# Patient Record
Sex: Female | Born: 1995
Health system: Southern US, Community
[De-identification: ages and names within clinical notes are randomized; demographics above are authoritative.]

## PROBLEM LIST (undated history)

## (undated) DIAGNOSIS — K589 Irritable bowel syndrome without diarrhea: Secondary | ICD-10-CM

## (undated) HISTORY — DX: Irritable bowel syndrome, unspecified: K58.9

---

## 2013-11-05 ENCOUNTER — Encounter: Payer: Self-pay | Admitting: Family Medicine

## 2013-11-05 ENCOUNTER — Ambulatory Visit (INDEPENDENT_AMBULATORY_CARE_PROVIDER_SITE_OTHER): Payer: Managed Care, Other (non HMO) | Admitting: Family Medicine

## 2013-11-05 VITALS — BP 117/72 | HR 93 | Temp 99.1°F | Resp 16 | Ht 63.5 in | Wt 120.2 lb

## 2013-11-05 DIAGNOSIS — Z7689 Persons encountering health services in other specified circumstances: Secondary | ICD-10-CM

## 2013-11-05 DIAGNOSIS — Z0000 Encounter for general adult medical examination without abnormal findings: Secondary | ICD-10-CM

## 2013-11-05 DIAGNOSIS — Z113 Encounter for screening for infections with a predominantly sexual mode of transmission: Secondary | ICD-10-CM

## 2013-11-05 DIAGNOSIS — K589 Irritable bowel syndrome without diarrhea: Secondary | ICD-10-CM

## 2013-11-05 DIAGNOSIS — Z3009 Encounter for other general counseling and advice on contraception: Secondary | ICD-10-CM

## 2013-11-05 DIAGNOSIS — Z23 Encounter for immunization: Secondary | ICD-10-CM

## 2013-11-05 MED ORDER — NORETHIN ACE-ETH ESTRAD-FE 1-20 MG-MCG(24) PO CHEW: CHEWABLE_TABLET | ORAL | Status: DC

## 2013-11-05 MED ORDER — OMEPRAZOLE 40 MG PO CPDR
40.0000 mg | DELAYED_RELEASE_CAPSULE | Freq: Every day | ORAL | Status: DC
Start: 2013-11-05 — End: 2014-12-31

## 2013-11-05 NOTE — Patient Instructions (Signed)
Great to see you today, I will be in touch with your labs.  Take care!

## 2013-11-05 NOTE — Progress Notes (Addendum)
[facility] [address] [place] [address] [phone]  Date:  11/05/2013   Name:  Misty Williams   DOB:  11-02-1995   MRN:  [id number]  PCP:  No primary provider on file.    Chief Complaint: Establish Care and Irritable Bowel Syndrome   History of Present Illness:  Misty Williams is a 18 y.o. very pleasant female patient who presents with the following:  Here today to establish care.  She is generally healthy except for IBS.  She works full- time doing hair.   She is on OCP and prilosec.  She needs her prilosec refilled and is running low on her OCP.   She does not yet need a pap (she has actually had a couple of paps in the past but never an abnormal)   She has had sx of IBS for a couple of years, formally dx in march of this year.  She did see GI,but has not yet had a colonoscopy.   She would like gonorrhea/ chlamydia screening today but declines BW  There are no active problems to display for this patient.   Past Medical History  Diagnosis Date  . IBS (irritable bowel syndrome)     No past surgical history on file.  History  Substance Use Topics  . Smoking status: Never Smoker   . Smokeless tobacco: Not on file  . Alcohol Use: Not on file    Family History  Problem Relation Age of Onset  . Hyperlipidemia Mother   . Hypertension Mother   . Heart disease Maternal Grandfather     No Known Allergies  Medication list has been reviewed and updated.  No current outpatient prescriptions on file prior to visit.   No current facility-administered medications on file prior to visit.    Review of Systems:  As per HPI- otherwise negative.   Physical Examination: Filed Vitals:   11/05/13 1044  BP: 117/72  Pulse: 93  Temp: 99.1 F (37.3 C)  Resp: 16   Filed Vitals:   11/05/13 1044  Height: 5' 3.5" (1.613 m)  Weight: 120 lb 3.2 oz (54.522 kg)   Body mass index is 20.96 kg/(m^2). Ideal Body Weight: Weight in (lb) to have BMI = 25:  143.1  GEN: WDWN, NAD, Non-toxic, A & O x 3, looks well HEENT: Atraumatic, Normocephalic. Neck supple. No masses, No LAD. Ears and Nose: No external deformity. CV: RRR, No M/G/R. No JVD. No thrill. No extra heart sounds. PULM: CTA B, no wheezes, crackles, rhonchi. No retractions. No resp. distress. No accessory muscle use. ABD: S, NT, ND EXTR: No c/c/e NEURO Normal gait.  PSYCH: Normally interactive. Conversant. Not depressed or anxious appearing.  Calm demeanor.    Assessment and Plan: IBS (irritable bowel syndrome) - Plan: omeprazole (PRILOSEC) 40 MG capsule  Encounter for other general counseling or advice on contraception - Plan: Norethin Ace-Eth Estrad-FE (MINASTRIN 24 FE) 1-20 MG-MCG(24) CHEW  Screening for STD (sexually transmitted disease) - Plan: GC/Chlamydia Probe Amp  Immunizations reviewed and up to date - Plan: Flu vaccine greater than or equal to 3yo preservative free IM  Healthy young lady here to establish care.  Flu shot, refilled her medications and did genprobe  Signed Abbe Amsterdam, MD  9/29: received genprobe, negative.  Called and LMOM that her labs are negative.

## 2013-11-06 LAB — GC/CHLAMYDIA PROBE AMP
CT Probe RNA: NEGATIVE
GC Probe RNA: NEGATIVE

## 2014-03-15 ENCOUNTER — Ambulatory Visit (INDEPENDENT_AMBULATORY_CARE_PROVIDER_SITE_OTHER): Payer: BLUE CROSS/BLUE SHIELD | Admitting: Family Medicine

## 2014-03-15 VITALS — BP 100/68 | HR 79 | Temp 98.7°F | Resp 18 | Ht 64.0 in | Wt 125.0 lb

## 2014-03-15 DIAGNOSIS — J029 Acute pharyngitis, unspecified: Secondary | ICD-10-CM

## 2014-03-15 DIAGNOSIS — Z3009 Encounter for other general counseling and advice on contraception: Secondary | ICD-10-CM

## 2014-03-15 MED ORDER — AMOXICILLIN 875 MG PO TABS: 875.0000 mg | ORAL_TABLET | Freq: Two times a day (BID) | ORAL | Status: DC

## 2014-03-15 MED ORDER — NORETHIN ACE-ETH ESTRAD-FE 1-20 MG-MCG(24) PO CHEW: CHEWABLE_TABLET | ORAL | Status: DC

## 2014-03-15 NOTE — Progress Notes (Signed)
° °  Subjective:    Patient ID: Misty Williams, female    DOB: [birth year], 19 y.o.   MRN: [id number] This chart was scribed for Elvina SidleKurt Lauenstein, MD by Littie Deedsichard Sun, Medical Scribe. This patient was seen in Room 10 and the patient's care was started at 2:17 PM.   HPI HPI Comments: Misty Williams is a 19 y.o. female who [facility] complaining of gradual onset URI symptoms that started earlier today. Patient reports having sore throat, nasal congestion, ear pain, ears feeling stopped, and sinus pressure. Patient just came back from [place] recently and states that she had contact with someone with strep throat. NKDA.  Patient is a Production designer, theatre/television/filmmanager at [employer].  Review of Systems  HENT: Positive for congestion, ear pain, sinus pressure and sore throat.        Objective:   Physical Exam CONSTITUTIONAL: Well developed/well nourished HEAD: Normocephalic/atraumatic EYES: EOM/PERRL ENMT: Mucous membranes moist, reddened right posterior pharynx without exudeate NECK: supple no meningeal signs SPINE: entire spine nontender CV: S1/S2 noted, no murmurs/rubs/gallops noted LUNGS: Lungs are clear to auscultation bilaterally, no apparent distress ABDOMEN: soft, nontender, no rebound or guarding GU: no cva tenderness NEURO: Pt is awake/alert, moves all extremitiesx4 EXTREMITIES: pulses normal, full ROM SKIN: warm, color normal PSYCH: no abnormalities of mood noted        Assessment & Plan:   This chart was scribed in my presence and reviewed by me personally.    ICD-9-CM ICD-10-CM   1. Acute pharyngitis, unspecified pharyngitis type 462 J02.9 amoxicillin (AMOXIL) 875 MG tablet  2. Encounter for other general counseling or advice on contraception V25.09 Z30.09 Norethin Ace-Eth Estrad-FE (MINASTRIN 24 FE) 1-20 MG-MCG(24) CHEW     Signed, Elvina SidleKurt Lauenstein, MD

## 2014-04-03 ENCOUNTER — Ambulatory Visit (INDEPENDENT_AMBULATORY_CARE_PROVIDER_SITE_OTHER): Payer: BLUE CROSS/BLUE SHIELD | Admitting: Family Medicine

## 2014-04-03 VITALS — BP 118/78 | HR 84 | Temp 98.5°F | Resp 16 | Ht 64.5 in | Wt 125.2 lb

## 2014-04-03 DIAGNOSIS — J329 Chronic sinusitis, unspecified: Secondary | ICD-10-CM

## 2014-04-03 DIAGNOSIS — L309 Dermatitis, unspecified: Secondary | ICD-10-CM

## 2014-04-03 MED ORDER — AZITHROMYCIN 250 MG PO TABS: ORAL_TABLET | ORAL | Status: DC

## 2014-04-03 MED ORDER — FLUTICASONE PROPIONATE 50 MCG/ACT NA SUSP: NASAL | Status: DC

## 2014-04-03 NOTE — Patient Instructions (Addendum)
Consider yourself possibly allergic to amoxicillin. If it is an amoxicillin allergy, I would consider it as something mild. Amoxicillin can cause a nonallergic rash. However I would avoid all penicillins, but not necessarily all cephalosporins(Keflex, cephalexin, Omnicef, cefdinir, ceftriaxone, Rocephin, etc.)  Take the a Zithromax (azithromycin) 2 pills initially, then 1 daily for 4 days for antibiotic  Use the fluticasone nose spray 2 sprays each nostril twice daily for 3 days, then once daily  If you're rash develops more itching take Zyrtec (cetirizine) one pill daily, over-the-counter  Return if worse  Rash also was possibly pityriasis rosea, though probably amoxicillin induced.  Pityriasis Rosea Pityriasis rosea is a rash which is probably caused by a virus. It generally starts as a scaly, red patch on the trunk (the area of the body that a t-shirt would cover) but does not appear on sun exposed areas. The rash is usually preceded by an initial larger spot called the "herald patch" a week or more before the rest of the rash appears. Generally within one to two days the rash appears rapidly on the trunk, upper arms, and sometimes the upper legs. The rash usually appears as flat, oval patches of scaly pink color. The rash can also be raised and one is able to feel it with a finger. The rash can also be finely crinkled and may slough off leaving a ring of scale around the spot. Sometimes a mild sore throat is present with the rash. It usually affects children and young adults in the spring and autumn. Women are more frequently affected than men. TREATMENT  Pityriasis rosea is a self-limited condition. This means it goes away within 4 to 8 weeks without treatment. The spots may persist for several months, especially in darker-colored skin after the rash has resolved and healed. Benadryl and steroid creams may be used if itching is a problem. SEEK MEDICAL CARE IF:   Your rash does not go away or  persists longer than three months.  You develop fever and joint pain.  You develop severe headache and confusion.  You develop breathing difficulty, vomiting and/or extreme weakness. Document Released: 03/03/2001 Document Revised: 04/19/2011 Document Reviewed: 03/22/2008 [facility] Patient Information 2015 [place]. This information is not intended to replace advice given to you by your health care provider. Make sure you discuss any questions you have with your health care provider.

## 2014-04-03 NOTE — Progress Notes (Signed)
Subjective: Patient took only part of her amoxicillin prescription last time. She developed a trocar rash. That is continued to persist some. It did not itch a lot. She had a more prominent spot in her left upper arm axillary region about 1.5 cm in diameter. She still has her sinus symptoms. It improved but is not gone away. She is blowing some purulent and some clear mucus and continues to feel run down from that. She has little cough from that.  Objective: Sinuses nontender. Throat clear. Neck supple without nodes. Chest clear to all station. She has a friend splotchy rash on her trunk that has the appearance of a pityriasis rosea in resolution.  Assessment: Amoxicillin allergy versus pityriasis rosea rash Sinusitis  Plan: Treat her with a course of azithromycin to try and see if we can clear the sinuses up the rest of the way, though I told her this could still be just a prolonged viral issue. Explained to her that she has to be considered allergic to amoxicillin, though if it was a penicillin reaction it was a mild one. I don't think she needs to avoid cephalosporins though she needs to be cautious with him. There is no anaphylaxis.  See instructions

## 2014-04-15 ENCOUNTER — Ambulatory Visit (INDEPENDENT_AMBULATORY_CARE_PROVIDER_SITE_OTHER): Payer: BLUE CROSS/BLUE SHIELD | Admitting: Physician Assistant

## 2014-04-15 VITALS — BP 118/70 | HR 73 | Temp 98.2°F | Resp 16 | Ht 63.5 in | Wt 123.4 lb

## 2014-04-15 DIAGNOSIS — H9191 Unspecified hearing loss, right ear: Secondary | ICD-10-CM | POA: Diagnosis not present

## 2014-04-15 DIAGNOSIS — R0981 Nasal congestion: Secondary | ICD-10-CM | POA: Diagnosis not present

## 2014-04-15 MED ORDER — GUAIFENESIN ER 1200 MG PO TB12: 1.0000 | ORAL_TABLET | Freq: Two times a day (BID) | ORAL | Status: AC | PRN

## 2014-04-15 MED ORDER — IPRATROPIUM BROMIDE 0.03 % NA SOLN: 2.0000 | Freq: Two times a day (BID) | NASAL | Status: DC

## 2014-04-15 MED ORDER — PSEUDOEPHEDRINE HCL 60 MG PO TABS: 60.0000 mg | ORAL_TABLET | ORAL | Status: AC | PRN

## 2014-04-15 NOTE — Progress Notes (Signed)
[facility] [address], [place] [id number] [phone]  Date:  04/15/2014   Name:  Misty DublinCarlyn Tyminski   DOB:  [birth year]   MRN:  [id number]  PCP:  No primary care provider on file.    Chief Complaint: Ear Fullness   History of Present Illness:  Misty Williams is a 19 y.o. very pleasant female patient who presents with the following:  Patient was seen here 2 weeks ago for a sinus infection, and 1 month ago for pharyngitis.  These had resolved.  But 2 days ago, patient reports that she has been felt as if her right ear is clogged.  She has no fever, drainage, or dizziness.  She had some nasal congestion, but this has since resolved.  She has no facial weakness, or rash.     There are no active problems to display for this patient.   Past Medical History  Diagnosis Date  . IBS (irritable bowel syndrome)     History reviewed. No pertinent past surgical history.  History  Substance Use Topics  . Smoking status: Never Smoker   . Smokeless tobacco: Not on file  . Alcohol Use: Not on file    Family History  Problem Relation Age of Onset  . Hyperlipidemia Mother   . Hypertension Mother   . Heart disease Maternal Grandfather     Allergies  Allergen Reactions  . Penicillins Rash    Medication list has been reviewed and updated.  Current Outpatient Prescriptions on File Prior to Visit  Medication Sig Dispense Refill  . Norethin Ace-Eth Estrad-FE (MINASTRIN 24 FE) 1-20 MG-MCG(24) CHEW Take 1 daily 84 tablet 3  . omeprazole (PRILOSEC) 40 MG capsule Take 1 capsule (40 mg total) by mouth daily. 90 capsule 3  . fluticasone (FLONASE) 50 MCG/ACT nasal spray Use 2 sprays each nostril twice daily for 3 days, then once daily (Patient not taking: Reported on 04/15/2014) 16 g 1   No current facility-administered medications on file prior to visit.    Review of Systems: ROS otherwise unremarkable unless listed above.    Physical Examination: Filed Vitals:   04/15/14 1124  BP: 118/70  Pulse: 73  Temp: 98.2 F (36.8 C)  Resp: 16   Filed Vitals:   04/15/14 1124  Height: 5' 3.5" (1.613 m)  Weight: 123 lb 6.4 oz (55.974 kg)   Body mass index is 21.51 kg/(m^2). Ideal Body Weight: Weight in (lb) to have BMI = 25: 143.1  Physical Exam  Constitutional: She appears well-developed and well-nourished.  HENT:  Right Ear: Tympanic membrane, external ear and ear canal normal.  Left Ear: Tympanic membrane, external ear and ear canal normal.  Nose: Rhinorrhea present. No mucosal edema.  Mouth/Throat: No oropharyngeal exudate, posterior oropharyngeal edema or posterior oropharyngeal erythema.  Cardiovascular: Normal rate and regular rhythm.  Exam reveals no distant heart sounds and no friction rub.   No murmur heard. Pulmonary/Chest: No respiratory distress. She has no decreased breath sounds. She has no wheezes.    Hearing Screening   125Hz  250Hz  500Hz  1000Hz  2000Hz  4000Hz  8000Hz   Right ear:   30 10 15 20  00  Left ear:   00 00 05 05 00     Assessment and Plan: 19 year old female is here for chief complaint of hearing loss.  I am most suspicious of eustachian tube dysfunction which may take days to weeks to return to normal.  VS nl.  Audiometry showed mild hearing loss of right ear.  Passed whispered test from 5 ft.  I will treat supportively to decrease mucus, and rtc if sxs do not resolve in 2 weeks, where ent referral would be appreciated.   Hearing loss, right - Plan: PR TYMPANOMETRY, ipratropium (ATROVENT) 0.03 % nasal spray  Nasal congestion - Plan: pseudoephedrine (SUDAFED) 60 MG tablet, Guaifenesin (MUCINEX MAXIMUM STRENGTH) 1200 MG TB12  Trena Platt, PA-C [facility] 3/9/201610:14 AM

## 2014-04-15 NOTE — Patient Instructions (Signed)
Stay hydrated.  Take the medication as prescribed.

## 2014-12-31 ENCOUNTER — Ambulatory Visit (INDEPENDENT_AMBULATORY_CARE_PROVIDER_SITE_OTHER): Payer: BLUE CROSS/BLUE SHIELD | Admitting: Physician Assistant

## 2014-12-31 ENCOUNTER — Encounter: Payer: Self-pay | Admitting: Physician Assistant

## 2014-12-31 VITALS — BP 103/68 | HR 80 | Temp 98.5°F | Resp 16 | Ht 64.0 in | Wt 118.0 lb

## 2014-12-31 DIAGNOSIS — Z3009 Encounter for other general counseling and advice on contraception: Secondary | ICD-10-CM | POA: Diagnosis not present

## 2014-12-31 DIAGNOSIS — J069 Acute upper respiratory infection, unspecified: Secondary | ICD-10-CM

## 2014-12-31 MED ORDER — NORETHIN ACE-ETH ESTRAD-FE 1-20 MG-MCG(24) PO CHEW: CHEWABLE_TABLET | ORAL | Status: DC

## 2014-12-31 NOTE — Progress Notes (Signed)
[facility] [address] [place] [address] [phone]  Date:  12/31/2014   Name:  Misty Williams   DOB:  10-03-1995   MRN:  [id number]  PCP:  No primary care provider on file.    Chief Complaint: Sore Throat; Cough; Sinus Pressure; and Medication Refill   History of Present Illness:  This is a 19 y.o. female with PMH IBS who is presenting with uri symptoms for the past 10 days. Started with runny nose and fatigue. Intermittent cough throughout. Started with sore throat 4 days ago. Started with hoarseness 2 days ago. Overall cough and nasal congestion getting better. Sore throat about the same. She is having some bilateral ear soreness. Denies sob/wheezing, fever, chills. Her sister was sick with similar illness before her.  Aggravating/alleviating factors: Mucinex D, sudafed and nyquil - nothing helping much.  History of asthma: no  History of env allergies: spring time allergies Tobacco use: no  Pt needs refill of OCP. She has been on OCP for several years and no problems. She was started on OCP d/t painful menses. She has not had any new sexual partners and does not want STD testing today. She is due for pap - has upcoming CPE with Dr. Patsy Lager 03/2015.   Review of Systems:  Review of Systems See HPI  There are no active problems to display for this patient.   Prior to Admission medications   Medication Sig Start Date End Date Taking? Authorizing Provider  fluticasone (FLONASE) 50 MCG/ACT nasal spray Use 2 sprays each nostril twice daily for 3 days, then once daily 04/03/14  Yes Peyton Najjar, MD  Norethin Ace-Eth Estrad-FE (MINASTRIN 24 FE) 1-20 MG-MCG(24) CHEW Take 1 daily 03/15/14  Yes Elvina Sidle, MD           Allergies  Allergen Reactions  . Penicillins Rash    History reviewed. No pertinent past surgical history.  Social History  Substance Use Topics  . Smoking status: Never Smoker   . Smokeless tobacco: None  . Alcohol Use: None     Family History  Problem Relation Age of Onset  . Hyperlipidemia Mother   . Hypertension Mother   . Heart disease Maternal Grandfather     Medication list has been reviewed and updated.  Physical Examination:  Physical Exam  Constitutional: She is oriented to person, place, and time. She appears well-developed and well-nourished. No distress.  HENT:  Head: Normocephalic and atraumatic.  Right Ear: Hearing, tympanic membrane, external ear and ear canal normal.  Left Ear: Hearing, tympanic membrane, external ear and ear canal normal.  Nose: Nose normal. Right sinus exhibits no maxillary sinus tenderness and no frontal sinus tenderness. Left sinus exhibits no maxillary sinus tenderness and no frontal sinus tenderness.  Mouth/Throat: Uvula is midline and mucous membranes are normal. Posterior oropharyngeal erythema present. No oropharyngeal exudate or posterior oropharyngeal edema.  Eyes: Conjunctivae and lids are normal. Right eye exhibits no discharge. Left eye exhibits no discharge. No scleral icterus.  Cardiovascular: Normal rate, regular rhythm, normal heart sounds and normal pulses.   No murmur heard. Pulmonary/Chest: Effort normal and breath sounds normal. No respiratory distress. She has no wheezes. She has no rhonchi. She has no rales.  Musculoskeletal: Normal range of motion.  Lymphadenopathy:       Head (right side): No submental, no submandibular and no tonsillar adenopathy present.       Head (left side): No submental, no submandibular and no tonsillar adenopathy present.  She has no cervical adenopathy.  Neurological: She is alert and oriented to person, place, and time.  Skin: Skin is warm, dry and intact. No lesion and no rash noted.  Psychiatric: She has a normal mood and affect. Her speech is normal and behavior is normal. Thought content normal.    BP 103/68 mmHg  Pulse 80  Temp(Src) 98.5 F (36.9 C) (Oral)  Resp 16  Ht 5\' 4"  (1.626 m)  Wt 118 lb (53.524  kg)  BMI 20.24 kg/m2  SpO2 99%  LMP 12/19/2014  Assessment and Plan:  1. Encounter for other general counseling or advice on contraception Doing well on current OCP. Refilled. F/u with Dr. Patsy [place]ager[place] 03/2015. - Norethin Ace-Eth Estrad-FE (MINASTRIN 24 FE) 1-20 MG-MCG(24) CHEW; Take 1 daily  Dispense: 84 tablet; Refill: 3  2. Viral URI Symptoms likely d/t viral uri. Symptoms overall improving. Exam benign and vitals wnl. She has atrovent at home that she will use BID. Ibuprofen for sore throat. She will let me know if symptoms not improved in 1 week and will consider prescribing abx.   Roswell MinersNicole V. Dyke BrackettBush, PA-C, MHS [facility]  12/31/2014

## 2014-12-31 NOTE — Patient Instructions (Signed)
Use atrovent 2 sprays twice a day. Stop mucinex and sudafed. Drink plenty of water, at least 64 oz water a day. Ibuprofen as needed for sore throat. Rest your voice. Let me know if your symptoms have not improved in 1 week.

## 2015-03-12 ENCOUNTER — Encounter: Payer: BLUE CROSS/BLUE SHIELD | Admitting: Family Medicine

## 2015-10-08 ENCOUNTER — Ambulatory Visit (INDEPENDENT_AMBULATORY_CARE_PROVIDER_SITE_OTHER): Payer: No Typology Code available for payment source | Admitting: Physician Assistant

## 2015-10-08 VITALS — BP 100/70 | HR 82 | Temp 99.0°F | Resp 16 | Ht 63.0 in | Wt 124.4 lb

## 2015-10-08 DIAGNOSIS — Z309 Encounter for contraceptive management, unspecified: Secondary | ICD-10-CM | POA: Diagnosis not present

## 2015-10-08 MED ORDER — NORETHINDRONE ACET-ETHINYL EST 1-20 MG-MCG PO TABS: 1.0000 | ORAL_TABLET | Freq: Every day | ORAL | 11 refills | Status: DC

## 2015-10-08 NOTE — Patient Instructions (Addendum)
Ethinyl Estradiol; Norethindrone Acetate; Ferrous fumarate tablets (contraception) What is this medicine? ETHINYL ESTRADIOL; NORETHINDRONE ACETATE; FERROUS FUMARATE (ETH in il es tra DYE ole; nor eth IN drone AS e tate; FER us FUE ma rate) is an oral contraceptive. The products combine two types of female hormones, an estrogen and a progestin. They are used to prevent ovulation and pregnancy. Some products are also used to treat acne in females. This medicine may be used for other purposes; ask your health care provider or pharmacist if you have questions. What should I tell my health care provider before I take this medicine? They need to know if you have any of these conditions: -abnormal vaginal bleeding -blood vessel disease -breast, cervical, endometrial, ovarian, liver, or uterine cancer -diabetes -gallbladder disease -heart disease or recent heart attack -high blood pressure -high cholesterol -history of blood clots -kidney disease -liver disease -migraine headaches -smoke tobacco -stroke -systemic lupus erythematosus (SLE) -an unusual or allergic reaction to estrogens, progestins, other medicines, foods, dyes, or preservatives -pregnant or trying to get pregnant -breast-feeding How should I use this medicine? Take this medicine by mouth. To reduce nausea, this medicine may be taken with food. Follow the directions on the prescription label. Take this medicine at the same time each day and in the order directed on the package. Do not take your medicine more often than directed. A patient package insert for the product will be given with each prescription and refill. Read this sheet carefully each time. The sheet may change frequently. Contact your pediatrician regarding the use of this medicine in children. Special care may be needed. This medicine has been used in female children who have started having menstrual periods. Overdosage: If you think you have taken too much of this  medicine contact a poison control center or emergency room at once. NOTE: This medicine is only for you. Do not share this medicine with others. What if I miss a dose? If you miss a dose, refer to the patient information sheet you received with your medicine for direction. If you miss more than one pill, this medicine may not be as effective and you may need to use another form of birth control. What may interact with this medicine? -acetaminophen -antibiotics or medicines for infections, especially rifampin, rifabutin, rifapentine, and griseofulvin, and possibly penicillins or tetracyclines -aprepitant -ascorbic acid (vitamin C) -atorvastatin -barbiturate medicines, such as phenobarbital -bosentan -carbamazepine -caffeine -clofibrate -cyclosporine -dantrolene -doxercalciferol -felbamate -grapefruit juice -hydrocortisone -medicines for anxiety or sleeping problems, such as diazepam or temazepam -medicines for diabetes, including pioglitazone -mineral oil -modafinil -mycophenolate -nefazodone -oxcarbazepine -phenytoin -prednisolone -ritonavir or other medicines for HIV infection or AIDS -rosuvastatin -selegiline -soy isoflavones supplements -St. John's wort -tamoxifen or raloxifene -theophylline -thyroid hormones -topiramate -warfarin This list may not describe all possible interactions. Give your health care provider a list of all the medicines, herbs, non-prescription drugs, or dietary supplements you use. Also tell them if you smoke, drink alcohol, or use illegal drugs. Some items may interact with your medicine. What should I watch for while using this medicine? Visit your doctor or health care professional for regular checks on your progress. You will need a regular breast and pelvic exam and Pap smear while on this medicine. Use an additional method of contraception during the first cycle that you take these tablets. If you have any reason to think you are pregnant,  stop taking this medicine right away and contact your doctor or health care professional. If you are taking this  medicine for hormone related problems, it may take several cycles of use to see improvement in your condition. Smoking increases the risk of getting a blood clot or having a stroke while you are taking birth control pills, especially if you are more than 22 years old. You are strongly advised not to smoke. This medicine can make your body retain fluid, making your fingers, hands, or ankles swell. Your blood pressure can go up. Contact your doctor or health care professional if you feel you are retaining fluid. This medicine can make you more sensitive to the sun. Keep out of the sun. If you cannot avoid being in the sun, wear protective clothing and use sunscreen. Do not use sun lamps or tanning beds/booths. If you wear contact lenses and notice visual changes, or if the lenses begin to feel uncomfortable, consult your eye care specialist. In some women, tenderness, swelling, or minor bleeding of the gums may occur. Notify your dentist if this happens. Brushing and flossing your teeth regularly may help limit this. See your dentist regularly and inform your dentist of the medicines you are taking. If you are going to have elective surgery, you may need to stop taking this medicine before the surgery. Consult your health care professional for advice. This medicine does not protect you against HIV infection (AIDS) or any other sexually transmitted diseases. What side effects may I notice from receiving this medicine? Side effects that you should report to your doctor or health care professional as soon as possible: -allergic reactions like skin rash, itching or hives, swelling of the face, lips, or tongue -breast tissue changes or discharge -changes in vaginal bleeding during your period or between your periods -changes in vision -chest pain -confusion -coughing up  blood -dizziness -feeling faint or lightheaded -headaches or migraines -leg, arm or groin pain -loss of balance or coordination -severe or sudden headaches -stomach pain (severe) -sudden shortness of breath -sudden numbness or weakness of the face, arm or leg -symptoms of vaginal infection like itching, irritation or unusual discharge -tenderness in the upper abdomen -trouble speaking or understanding -vomiting -yellowing of the eyes or skin Side effects that usually do not require medical attention (Report these to your doctor or health care professional if they continue or are bothersome.): -breakthrough bleeding and spotting that continues beyond the 3 initial cycles of pills -breast tenderness -mood changes, anxiety, depression, frustration, anger, or emotional outbursts -increased sensitivity to sun or ultraviolet light -nausea -skin rash, acne, or brown spots on the skin -weight gain (slight) This list may not describe all possible side effects. Call your doctor for medical advice about side effects. You may report side effects to FDA at 1-800-FDA-1088. Where should I keep my medicine? Keep out of the reach of children. Store at room temperature between 15 and 30 degrees C (59 and 86 degrees F). Throw away any unused medicine after the expiration date. NOTE: This sheet is a summary. It may not cover all possible information. If you have questions about this medicine, talk to your doctor, pharmacist, or health care provider.    2016, Elsevier/Gold Standard. (2012-05-31 15:05:22)    IF you received an x-ray today, you will receive an invoice from [facility]. Please contact [facility] at [phone] with questions or concerns regarding your invoice.   IF you received labwork today, you will receive an invoice from [employer]. Please contact [facility] at [phone] with questions or concerns regarding your invoice.   Our billing  staff  will not be able to assist you with questions regarding bills from these companies.  You will be contacted with the lab results as soon as they are available. The fastest way to get your results is to activate your My Chart account. Instructions are located on the last page of this paperwork. If you have not heard from [place] regarding the results in 2 weeks, please contact this office.

## 2015-10-08 NOTE — Progress Notes (Addendum)
Patient ID: Bishop DublinCarlyn Fullard, female   DOB: February 07, 1996, 20 y.o.   MRN: [id number] [facility] [address], [place] [id number] [phone]  Date:  10/08/2015   Name:  Bishop DublinCarlyn Lorman   DOB:  February 07, 1996   MRN:  [id number]  PCP:  No PCP Per Patient   By signing my name below, I, Essence Howell, attest that this documentation has been prepared under the direction and in the presence of Trena PlattStephanie English, PA-C Electronically Signed: Charline BillsEssence Howell, ED Scribe 10/08/2015 at 10:49 AM.  History of Present Illness: Chief Complaint  Patient presents with   Medication Refill    change in meds new insurance    Mitali Claudine MoutonMays is a 20 y.o. female patient who [facility] medication change. Pt states that she recently switched insurance and her birth control which used to be free now costs $140/month. Pt states that she has been taking Minastrin for several years and has never missed a dose. She denies a personal or familial h/o bleeding disorders. No recent hospitalizations.  There are no active problems to display for this patient.   Past Medical History:  Diagnosis Date   IBS (irritable bowel syndrome)     No past surgical history on file.  Social History  Substance Use Topics   Smoking status: Never Smoker   Smokeless tobacco: Not on file   Alcohol use Not on file    Family History  Problem Relation Age of Onset   Hyperlipidemia Mother    Hypertension Mother    Heart disease Maternal Grandfather     Allergies  Allergen Reactions   Penicillins Rash    Medication list has been reviewed and updated.  Current Outpatient Prescriptions on File Prior to Visit  Medication Sig Dispense Refill   Norethin Ace-Eth Estrad-FE (MINASTRIN 24 FE) 1-20 MG-MCG(24) CHEW Take 1 daily 84 tablet 3   fluticasone (FLONASE) 50 MCG/ACT nasal spray Use 2 sprays each nostril twice daily for 3 days, then once daily (Patient not taking: Reported on 10/08/2015) 16 g 1    ipratropium (ATROVENT) 0.03 % nasal spray Place 2 sprays into both nostrils 2 (two) times daily. 30 mL 0   No current facility-administered medications on file prior to visit.     Review of Systems  Endo/Heme/Allergies: Does not bruise/bleed easily.    Physical Examination: BP 100/70 (BP Location: Right Arm, Patient Position: Sitting, Cuff Size: Normal)    Pulse 82    Temp 99 F (37.2 C) (Oral)    Resp 16    Ht 5\' 3"  (1.6 m)    Wt 124 lb 6.4 oz (56.4 kg)    LMP 09/09/2015 (Exact Date)    SpO2 100%    BMI 22.04 kg/m  Ideal Body Weight: @FLOWAMB ([id number])@([phone])@  Physical Exam  Constitutional: She is oriented to person, place, and time. She appears well-developed and well-nourished. No distress.  HENT:  Head: Normocephalic and atraumatic.  Right Ear: External ear normal.  Left Ear: External ear normal.  Eyes: Conjunctivae and EOM are normal. Pupils are equal, round, and reactive to light.  Cardiovascular: Normal rate.   Pulmonary/Chest: Effort normal. No respiratory distress.  Neurological: She is alert and oriented to person, place, and time.  Skin: She is not diaphoretic.  Psychiatric: She has a normal mood and affect. Her behavior is normal.    Assessment and Plan: Bishop DublinCarlyn Swatek is a 20 y.o. female who is here today for medication refill. Have attempted to choose  med that is cost-effective.  Advised to contact us if this is overpriced. Advised contacting insurance about what is the medicine covered. Encounter for contraceptive management, unspecified encounter - Plan: norethindrone-ethinyl estradiol (MICROGESTIN,JUNEL,LOESTRIN) 1-20 MG-MCG tablet  Trena Platt, PA-C [facility] 10/08/2015 10:31 AM

## 2016-11-08 ENCOUNTER — Other Ambulatory Visit: Payer: Self-pay | Admitting: Physician Assistant

## 2016-11-08 DIAGNOSIS — Z309 Encounter for contraceptive management, unspecified: Secondary | ICD-10-CM

## 2016-11-09 ENCOUNTER — Encounter: Payer: Self-pay | Admitting: Physician Assistant

## 2016-11-09 ENCOUNTER — Ambulatory Visit (INDEPENDENT_AMBULATORY_CARE_PROVIDER_SITE_OTHER): Payer: No Typology Code available for payment source | Admitting: Physician Assistant

## 2016-11-09 VITALS — BP 100/64 | HR 68 | Temp 98.9°F | Resp 16 | Ht 63.5 in | Wt 121.6 lb

## 2016-11-09 DIAGNOSIS — Z124 Encounter for screening for malignant neoplasm of cervix: Secondary | ICD-10-CM

## 2016-11-09 DIAGNOSIS — Z3041 Encounter for surveillance of contraceptive pills: Secondary | ICD-10-CM

## 2016-11-09 DIAGNOSIS — Z0000 Encounter for general adult medical examination without abnormal findings: Secondary | ICD-10-CM | POA: Diagnosis not present

## 2016-11-09 MED ORDER — NORETHIN ACE-ETH ESTRAD-FE 1-20 MG-MCG PO TABS: 1.0000 | ORAL_TABLET | Freq: Every day | ORAL | 11 refills | Status: DC

## 2016-11-09 NOTE — Progress Notes (Signed)
    11/09/2016 5:22 PM   DOB: 1995/08/12 / MRN: [id number]  SUBJECTIVE:  Misty Williams is a 21 y.o. female presenting for OCP refill. Tells me that her pap is not current at this time and she would like this today. She does not smoke. She has had the HPV series.   She is allergic to penicillins.   She  has a past medical history of IBS (irritable bowel syndrome).    She  reports that she has never smoked. She has never used smokeless tobacco. She reports that she does not use drugs. She  has no sexual activity history on file. The patient  has no past surgical history on file.  Her family history includes Heart disease in her maternal grandfather; Hyperlipidemia in her mother; Hypertension in her mother.  Review of Systems  Constitutional: Negative for chills, diaphoresis and fever.  Eyes: Negative.   Respiratory: Negative for shortness of breath.   Cardiovascular: Negative for chest pain, orthopnea and leg swelling.  Gastrointestinal: Negative for abdominal pain, blood in stool, constipation, diarrhea, heartburn, melena, nausea and vomiting.  Genitourinary: Negative for flank pain.  Skin: Negative for rash.  Neurological: Negative for dizziness, sensory change, speech change, focal weakness and headaches.    The problem list and medications were reviewed and updated by myself where necessary and exist elsewhere in the encounter.   OBJECTIVE:  BP 100/64 (BP Location: Left Arm, Patient Position: Sitting, Cuff Size: Normal)   Pulse 68   Temp 98.9 F (37.2 C) (Oral)   Resp 16   Ht 5' 3.5" (1.613 m)   Wt 121 lb 9.6 oz (55.2 kg)   LMP 11/05/2016   SpO2 100%   BMI 21.20 kg/m   Physical Exam  Constitutional: She is active.  Non-toxic appearance.  Cardiovascular: Normal rate, regular rhythm, S1 normal, S2 normal, normal heart sounds and intact distal pulses.  Exam reveals no gallop, no friction rub and no decreased pulses.   No murmur heard. Pulmonary/Chest: Effort normal. No  stridor. No tachypnea. No respiratory distress. She has no wheezes. She has no rales.  Abdominal: She exhibits no distension.  Genitourinary: Vagina normal. Pelvic exam was performed with patient prone. No labial fusion. There is no rash, tenderness, lesion or injury on the right labia. There is no rash, tenderness, lesion or injury on the left labia. No erythema, tenderness or bleeding in the vagina. No foreign body in the vagina. No signs of injury around the vagina. No vaginal discharge found.  Musculoskeletal: She exhibits no edema.  Neurological: She is alert.  Skin: Skin is warm and dry. She is not diaphoretic. No pallor.    No results found for this or any previous visit (from the past 72 hour(s)).  No results found.  ASSESSMENT AND PLAN:  Shaletha was seen today for contraception.  Diagnoses and all orders for this visit:  Annual physical exam  Encounter for surveillance of contraceptive pills -     norethindrone-ethinyl estradiol (JUNEL FE 1/20) 1-20 MG-MCG tablet; Take 1 tablet by mouth daily.  Screening for cervical cancer -     Pap IG, CT/NG w/ reflex HPV when ASC-U    The patient is advised to call or return to clinic if she does not see an improvement in symptoms, or to seek the care of the closest emergency department if she worsens with the above plan.   Deliah Boston, MHS, PA-C [facility] 11/09/2016 5:22 PM

## 2016-11-09 NOTE — Patient Instructions (Addendum)
Take one tab daily for three weeks then stop for 7 days to allow your period to take place.     IF you received an x-ray today, you will receive an invoice from [facility]. Please contact [facility] at [phone] with questions or concerns regarding your invoice.   IF you received labwork today, you will receive an invoice from [place] [facility]. Please contact [facility] at [phone] with questions or concerns regarding your invoice.   Our billing staff will not be able to assist you with questions regarding bills from these companies.  You will be contacted with the lab results as soon as they are available. The fastest way to get your results is to activate your My Chart account. Instructions are located on the last page of this paperwork. If you have not heard from [place] regarding the results in 2 weeks, please contact this office.

## 2016-11-11 LAB — PAP IG, CT-NG, RFX HPV ASCU
CHLAMYDIA, NUC. ACID AMP: NEGATIVE
GONOCOCCUS BY NUCLEIC ACID AMP: NEGATIVE
PAP Smear Comment: 0

## 2016-11-11 NOTE — Progress Notes (Signed)
Please make patient aware of results via letter. In the context of her overall presentation any abnormal values are of no clinical significance.  Deliah Boston PA-C, 11/11/2016 4:09 PM

## 2016-12-01 ENCOUNTER — Other Ambulatory Visit: Payer: Self-pay | Admitting: Physician Assistant

## 2016-12-01 DIAGNOSIS — Z309 Encounter for contraceptive management, unspecified: Secondary | ICD-10-CM

## 2017-05-10 ENCOUNTER — Encounter: Payer: Self-pay | Admitting: Physician Assistant

## 2017-05-23 ENCOUNTER — Ambulatory Visit: Payer: BLUE CROSS/BLUE SHIELD | Admitting: Physician Assistant

## 2017-05-23 ENCOUNTER — Encounter: Payer: Self-pay | Admitting: Physician Assistant

## 2017-05-23 ENCOUNTER — Ambulatory Visit: Payer: Self-pay | Admitting: *Deleted

## 2017-05-23 ENCOUNTER — Other Ambulatory Visit: Payer: Self-pay

## 2017-05-23 VITALS — BP 116/70 | HR 108 | Temp 98.6°F | Resp 16 | Ht 63.0 in | Wt 120.0 lb

## 2017-05-23 DIAGNOSIS — R05 Cough: Secondary | ICD-10-CM | POA: Diagnosis not present

## 2017-05-23 DIAGNOSIS — J22 Unspecified acute lower respiratory infection: Secondary | ICD-10-CM | POA: Diagnosis not present

## 2017-05-23 DIAGNOSIS — R059 Cough, unspecified: Secondary | ICD-10-CM

## 2017-05-23 MED ORDER — BENZONATATE 100 MG PO CAPS: 100.0000 mg | ORAL_CAPSULE | Freq: Three times a day (TID) | ORAL | 0 refills | Status: DC | PRN

## 2017-05-23 MED ORDER — AZITHROMYCIN 250 MG PO TABS: ORAL_TABLET | ORAL | 0 refills | Status: DC

## 2017-05-23 MED ORDER — GUAIFENESIN ER 1200 MG PO TB12: 1.0000 | ORAL_TABLET | Freq: Two times a day (BID) | ORAL | 1 refills | Status: DC | PRN

## 2017-05-23 NOTE — Telephone Encounter (Signed)
Pt called with upper respiratory symptoms. She states it started on Friday. She has body aches, sore throat, ears hurting, runny nose and cough' productive at times.  Low grade temp of 99.7 on Friday but down today. She is coughing at night that is keeping her up. Home care advice given to her with verbal understanding. Appointment made per protocol. She was asked to call back for any worsening symptoms. Pt voiced understanding. Reason for Disposition . Earache  Answer Assessment - Initial Assessment Questions 1. ONSET: "When did the cough begin?"      Friday morning 2. SEVERITY: "How bad is the cough today?"      Worst at night and pretty bad 3. RESPIRATORY DISTRESS: "Describe your breathing."      At times 4. FEVER: "Do you have a fever?" If so, ask: "What is your temperature, how was it measured, and when did it start?"     Low grade temp on Friday 99.7 and now 98.6 5. SPUTUM: "Describe the color of your sputum" (clear, white, yellow, green)     yellow 6. HEMOPTYSIS: "Are you coughing up any blood?" If so ask: "How much?" (flecks, streaks, tablespoons, etc.)     no 7. CARDIAC HISTORY: "Do you have any history of heart disease?" (e.g., heart attack, congestive heart failure)      no 8. LUNG HISTORY: "Do you have any history of lung disease?"  (e.g., pulmonary embolus, asthma, emphysema)     no 9. PE RISK FACTORS: "Do you have a history of blood clots?" (or: recent major surgery, recent prolonged travel, bedridden )     no 10. OTHER SYMPTOMS: "Do you have any other symptoms?" (e.g., runny nose, wheezing, chest pain)       Little runny nose, heaviness  in the chest 11. PREGNANCY: "Is there any chance you are pregnant?" "When was your last menstrual period?"       No, LMP at the end of March  12. TRAVEL: "Have you traveled out of the country in the last month?" (e.g., travel history, exposures)       no  Protocols used: COUGH - ACUTE PRODUCTIVE-A-AH

## 2017-05-23 NOTE — Patient Instructions (Addendum)
Please hydrate well with 64 oz of water or more.   The tessalon perles you can use for night time or daytime cough Increase the mucinex to 1200mg  every 12 hours, of the regular strength.    Cough, Adult A cough helps to clear your throat and lungs. A cough may last only 2-3 weeks (acute), or it may last longer than 8 weeks (chronic). Many different things can cause a cough. A cough may be a sign of an illness or another medical condition. Follow these instructions at home:  Pay attention to any changes in your cough.  Take medicines only as told by your doctor. ? If you were prescribed an antibiotic medicine, take it as told by your doctor. Do not stop taking it even if you start to feel better. ? Talk with your doctor before you try using a cough medicine.  Drink enough fluid to keep your pee (urine) clear or pale yellow.  If the air is dry, use a cold steam vaporizer or humidifier in your home.  Stay away from things that make you cough at work or at home.  If your cough is worse at night, try using extra pillows to raise your head up higher while you sleep.  Do not smoke, and try not to be around smoke. If you need help quitting, ask your doctor.  Do not have caffeine.  Do not drink alcohol.  Rest as needed. Contact a doctor if:  You have new problems (symptoms).  You cough up yellow fluid (pus).  Your cough does not get better after 2-3 weeks, or your cough gets worse.  Medicine does not help your cough and you are not sleeping well.  You have pain that gets worse or pain that is not helped with medicine.  You have a fever.  You are losing weight and you do not know why.  You have night sweats. Get help right away if:  You cough up blood.  You have trouble breathing.  Your heartbeat is very fast. This information is not intended to replace advice given to you by your health care provider. Make sure you discuss any questions you have with your health care  provider. Document Released: 10/08/2010 Document Revised: 07/03/2015 Document Reviewed: 04/03/2014 Elsevier Interactive Patient Education  2018 [employer]    IF you received an x-ray today, you will receive an invoice from [facility]. Please contact [facility] at [phone] with questions or concerns regarding your invoice.   IF you received labwork today, you will receive an invoice from [facility]. Please contact [facility] at [phone] with questions or concerns regarding your invoice.   Our billing staff will not be able to assist you with questions regarding bills from these companies.  You will be contacted with the lab results as soon as they are available. The fastest way to get your results is to activate your My Chart account. Instructions are located on the last page of this paperwork. If you have not heard from us regarding the results in 2 weeks, please contact this office.

## 2017-05-23 NOTE — Progress Notes (Signed)
PRIMARY CARE AT [facility] [address], [place] [address] [phone]  Date:  05/23/2017   Name:  Misty Williams   DOB:  26-May-1995   MRN:  [id number]  PCP:  Patient, No Pcp Per    History of Present Illness:  Misty Williams is a 22 y.o. female patient who presents to PCP with  Chief Complaint  Patient presents with  . Cough    x 3 days/ productive  . Sore Throat    ear pain/ x 3 days,voice is horse     3 days with aching and joint pain.  Hoarseness, throat pain, and ear pain bilaterally.  No congestion or runny nose.  Coughing is productive of yellow sputum.  She took nyquil and mucinex, which helped.  Friend was at her home who had bronchitis.  No fever.  No sob or dyspnea.   No sob or dyspnea.    There are no active problems to display for this patient.   Past Medical History:  Diagnosis Date  . IBS (irritable bowel syndrome)     No past surgical history on file.  Social History   Tobacco Use  . Smoking status: Never Smoker  . Smokeless tobacco: Never Used  Substance Use Topics  . Alcohol use: Not on file  . Drug use: No    Family History  Problem Relation Age of Onset  . Hyperlipidemia Mother   . Hypertension Mother   . Heart disease Maternal Grandfather     Allergies  Allergen Reactions  . Penicillins Rash    Medication list has been reviewed and updated.  Current Outpatient Medications on File Prior to Visit  Medication Sig Dispense Refill  . MICROGESTIN 1-20 MG-MCG tablet TAKE ONE TABLET BY MOUTH ONCE DAILY 21 tablet 0  . norethindrone-ethinyl estradiol (JUNEL FE 1/20) 1-20 MG-MCG tablet Take 1 tablet by mouth daily. 1 Package 11  . ipratropium (ATROVENT) 0.03 % nasal spray Place 2 sprays into both nostrils 2 (two) times daily. 30 mL 0   No current facility-administered medications on file prior to visit.     ROS ROS otherwise unremarkable unless listed above.  Physical Examination: BP 116/70   Pulse (!) 108   Temp 98.6 F (37 C) (Oral)    Resp 16   Ht 5\' 3"  (1.6 m)   Wt 120 lb (54.4 kg)   LMP 05/02/2017   SpO2 100%   BMI 21.26 kg/m  Ideal Body Weight: Weight in (lb) to have BMI = 25: 140.8  Physical Exam  Constitutional: She is oriented to person, place, and time. She appears well-developed and well-nourished. No distress.  HENT:  Head: Normocephalic and atraumatic.  Right Ear: Tympanic membrane, external ear and ear canal normal.  Left Ear: Tympanic membrane, external ear and ear canal normal.  Nose: No mucosal edema or rhinorrhea. Right sinus exhibits no maxillary sinus tenderness and no frontal sinus tenderness. Left sinus exhibits no maxillary sinus tenderness and no frontal sinus tenderness.  Mouth/Throat: No uvula swelling. No oropharyngeal exudate, posterior oropharyngeal edema or posterior oropharyngeal erythema.  Eyes: Pupils are equal, round, and reactive to light. Conjunctivae and EOM are normal.  Cardiovascular: Normal rate and regular rhythm. Exam reveals no gallop, no distant heart sounds and no friction rub.  No murmur heard. Pulmonary/Chest: Effort normal. No respiratory distress. She has no decreased breath sounds. She has no wheezes. She has no rhonchi.  Lower left lung base with minimal amphoric sounds.  Lymphadenopathy:  Head (right side): No submandibular, no tonsillar, no preauricular and no posterior auricular adenopathy present.       Head (left side): No submandibular, no tonsillar, no preauricular and no posterior auricular adenopathy present.  Neurological: She is alert and oriented to person, place, and time.  Skin: She is not diaphoretic.  Psychiatric: She has a normal mood and affect. Her behavior is normal.     Assessment and Plan: Misty Williams is a 22 y.o. female who is here today for cc of  Chief Complaint  Patient presents with  . Cough    x 3 days/ productive  . Sore Throat    ear pain/ x 3 days,voice is horse  some tachycardia and fatigue, there is concern for possible  lower pneumonia.  Will treat at this time.    Lower respiratory infection (e.g., bronchitis, pneumonia, pneumonitis, pulmonitis) - Plan: azithromycin (ZITHROMAX) 250 MG tablet, benzonatate (TESSALON) 100 MG capsule, Guaifenesin (MUCINEX MAXIMUM STRENGTH) 1200 MG TB12  Cough - Plan: benzonatate (TESSALON) 100 MG capsule, Guaifenesin (MUCINEX MAXIMUM STRENGTH) 1200 MG TB12  Misty PlattStephanie Zvi Duplantis, PA-C [facility] 4/15/20192:55 PM

## 2017-05-23 NOTE — Telephone Encounter (Signed)
Pt has been taking mucinex and nyquil.

## 2017-09-28 ENCOUNTER — Other Ambulatory Visit: Payer: Self-pay

## 2017-09-28 ENCOUNTER — Encounter: Payer: Self-pay | Admitting: Physician Assistant

## 2017-09-28 ENCOUNTER — Ambulatory Visit: Payer: BLUE CROSS/BLUE SHIELD | Admitting: Physician Assistant

## 2017-09-28 VITALS — BP 101/66 | HR 96 | Temp 100.2°F | Resp 16 | Ht 63.78 in | Wt 120.0 lb

## 2017-09-28 DIAGNOSIS — J029 Acute pharyngitis, unspecified: Secondary | ICD-10-CM | POA: Diagnosis not present

## 2017-09-28 LAB — POCT RAPID STREP A (OFFICE): RAPID STREP A SCREEN: NEGATIVE

## 2017-09-28 MED ORDER — AZITHROMYCIN 250 MG PO TABS: ORAL_TABLET | ORAL | 0 refills | Status: AC

## 2017-09-28 NOTE — Addendum Note (Signed)
Addended by: Ofilia NeasLARK, Mikah Poss L on: 09/28/2017 10:51 AM   Modules accepted: Orders

## 2017-09-28 NOTE — Addendum Note (Signed)
Addended by: Ofilia NeasLARK, Markis Langland L on: 09/28/2017 03:40 PM   Modules accepted: Orders

## 2017-09-28 NOTE — Progress Notes (Addendum)
09/28/2017 10:54 AM   DOB: 1995/03/31 / MRN: [id number]  SUBJECTIVE:  Misty Williams is a 22 y.o. female presenting for sore throat times 24 hours. Associates fever up to 101.  No nasal congestion and cough.  Some pain about the anterior neck. She has a penicillin allergy.   .She is allergic to penicillins.   She  has a past medical history of IBS (irritable bowel syndrome).    She  reports that she has never smoked. She has never used smokeless tobacco. She reports that she does not use drugs. She  has no sexual activity history on file. The patient  has no past surgical history on file.  Her family history includes Heart disease in her maternal grandfather; Hyperlipidemia in her mother; Hypertension in her mother.  ROS Per HPI.   The problem list and medications were reviewed and updated by myself where necessary and exist elsewhere in the encounter.   OBJECTIVE:  BP 101/66   Pulse 96   Temp 100.2 F (37.9 C) (Oral)   Resp 16   Ht 5' 3.78" (1.62 m)   Wt 120 lb (54.4 kg)   LMP  (LMP Unknown)   SpO2 100%   BMI 20.74 kg/m   Wt Readings from Last 3 Encounters:  09/28/17 120 lb (54.4 kg)  05/23/17 120 lb (54.4 kg)  11/09/16 121 lb 9.6 oz (55.2 kg)   Temp Readings from Last 3 Encounters:  09/28/17 100.2 F (37.9 C) (Oral)  05/23/17 98.6 F (37 C) (Oral)  11/09/16 98.9 F (37.2 C) (Oral)   BP Readings from Last 3 Encounters:  09/28/17 101/66  05/23/17 116/70  11/09/16 100/64   Pulse Readings from Last 3 Encounters:  09/28/17 96  05/23/17 (!) 108  11/09/16 68    Physical Exam  Constitutional: She is oriented to person, place, and time. She appears well-developed and well-nourished. No distress.  HENT:  Right Ear: Hearing, tympanic membrane, external ear and ear canal normal.  Left Ear: Hearing, tympanic membrane, external ear and ear canal normal.  Nose: No mucosal edema. Right sinus exhibits no maxillary sinus tenderness and no frontal sinus tenderness. Left  sinus exhibits no maxillary sinus tenderness and no frontal sinus tenderness.  Mouth/Throat: Uvula is midline and mucous membranes are normal. Mucous membranes are not dry. No uvula swelling. Posterior oropharyngeal erythema present. No oropharyngeal exudate, posterior oropharyngeal edema or tonsillar abscesses.  Eyes: Pupils are equal, round, and reactive to light. Conjunctivae and EOM are normal.  Cardiovascular: Normal rate, regular rhythm, S1 normal, S2 normal, normal heart sounds and intact distal pulses. Exam reveals no gallop, no friction rub and no decreased pulses.  No murmur heard. Pulmonary/Chest: Effort normal and breath sounds normal. No stridor. No respiratory distress. She has no wheezes. She has no rales.  Abdominal: She exhibits no distension.  Musculoskeletal: Normal range of motion. She exhibits no edema.  Lymphadenopathy:       Head (right side): Tonsillar adenopathy present. No submandibular adenopathy present.       Head (left side): Tonsillar adenopathy present. No submandibular adenopathy present.    She has no cervical adenopathy.  Neurological: She is alert and oriented to person, place, and time. No cranial nerve deficit. Gait normal.  Skin: Skin is warm and dry. No rash noted. She is not diaphoretic. No erythema.  Psychiatric: She has a normal mood and affect. Her behavior is normal.  Vitals reviewed.     Results for orders placed or performed in visit  on 09/28/17  POCT rapid strep A  Result Value Ref Range   Rapid Strep A Screen Negative Negative     ASSESSMENT AND PLAN:  [place] was seen today for sore throat and fever.  Diagnoses and all orders for this visit:  Sore throat: Symptoms isolated to throat with fever. Will cover for strep until proven otherwise on culture.  -     POCT rapid strep A    The patient is advised to call or return to clinic if she does not see an improvement in symptoms, or to seek the care of the closest emergency department  if she worsens with the above plan.   Deliah BostonMichael Allure Greaser, MHS, PA-C Primary Care at [facility] 09/28/2017 10:54 AM

## 2017-09-28 NOTE — Addendum Note (Signed)
Addended by: Ofilia NeasLARK, Ingrid Shifrin L on: 09/28/2017 10:57 AM   Modules accepted: Level of Service

## 2017-09-28 NOTE — Patient Instructions (Addendum)
Take 600 mg of ibuprofen every 6-8 hours as needed.    If you have lab work done today you will be contacted with your lab results within the next 2 weeks.  If you have not heard from us then please contact us. The fastest way to get your results is to register for My Chart.   IF you received an x-ray today, you will receive an invoice from [facility]. Please contact [facility] at [phone] with questions or concerns regarding your invoice.   IF you received labwork today, you will receive an invoice from [facility]. Please contact [facility] at [phone] with questions or concerns regarding your invoice.   Our billing staff will not be able to assist you with questions regarding bills from these companies.  You will be contacted with the lab results as soon as they are available. The fastest way to get your results is to activate your My Chart account. Instructions are located on the last page of this paperwork. If you have not heard from us regarding the results in 2 weeks, please contact this office.

## 2017-09-30 LAB — CULTURE, GROUP A STREP: Strep A Culture: NEGATIVE

## 2017-10-01 NOTE — Progress Notes (Signed)
Please make patient aware of results via letter. In the context of her overall presentation any abnormal values are of no clinical significance.  Deliah BostonMichael Clark PA-C, 10/01/2017 8:59 AM

## 2017-10-03 ENCOUNTER — Encounter: Payer: Self-pay | Admitting: *Deleted

## 2017-11-09 ENCOUNTER — Ambulatory Visit (INDEPENDENT_AMBULATORY_CARE_PROVIDER_SITE_OTHER): Payer: BLUE CROSS/BLUE SHIELD | Admitting: Emergency Medicine

## 2017-11-09 ENCOUNTER — Encounter: Payer: Self-pay | Admitting: Emergency Medicine

## 2017-11-09 ENCOUNTER — Other Ambulatory Visit: Payer: Self-pay

## 2017-11-09 VITALS — BP 130/82 | HR 83 | Temp 98.7°F | Resp 18 | Ht 63.78 in | Wt 122.6 lb

## 2017-11-09 DIAGNOSIS — M25531 Pain in right wrist: Secondary | ICD-10-CM

## 2017-11-09 DIAGNOSIS — M25532 Pain in left wrist: Secondary | ICD-10-CM | POA: Diagnosis not present

## 2017-11-09 DIAGNOSIS — N63 Unspecified lump in unspecified breast: Secondary | ICD-10-CM

## 2017-11-09 NOTE — Progress Notes (Signed)
Misty Williams 22 y.o.   Chief Complaint  Patient presents with  . Breast Mass    left breast x a few months ago states it hasnt gotten bigger   . Wrist Injury    wants to be checked due to her job     HISTORY OF PRESENT ILLNESS: This is a 22 y.o. female complaining of lump on her left breast that she noticed several months ago.  Getting a little bigger. Also complaining of some mild intermittent pain to both wrists most likely related to her job activities.  Denies numbness or tingling to the distal fingers.  HPI   Prior to Admission medications   Medication Sig Start Date End Date Taking? Authorizing Provider  JUNEL FE 1/20 1-20 MG-MCG tablet Take 1 tablet by mouth daily. 10/12/17  Yes [provider]    Allergies  Allergen Reactions  . Penicillins Rash    There are no active problems to display for this patient.   Past Medical History:  Diagnosis Date  . IBS (irritable bowel syndrome)     No past surgical history on file.  Social History   Socioeconomic History  . Marital status: Single    Spouse name: Not on file  . Number of children: Not on file  . Years of education: Not on file  . Highest education level: Not on file  Occupational History  . Not on file  Social Needs  . Financial resource strain: Not on file  . Food insecurity:    Worry: Not on file    Inability: Not on file  . Transportation needs:    Medical: Not on file    Non-medical: Not on file  Tobacco Use  . Smoking status: Never Smoker  . Smokeless tobacco: Never Used  Substance and Sexual Activity  . Alcohol use: Not on file  . Drug use: No  . Sexual activity: Not on file  Lifestyle  . Physical activity:    Days per week: Not on file    Minutes per session: Not on file  . Stress: Not on file  Relationships  . Social connections:    Talks on phone: Not on file    Gets together: Not on file    Attends religious service: Not on file    Active member of club or organization:  Not on file    Attends meetings of clubs or organizations: Not on file    Relationship status: Not on file  . Intimate partner violence:    Fear of current or ex partner: Not on file    Emotionally abused: Not on file    Physically abused: Not on file    Forced sexual activity: Not on file  Other Topics Concern  . Not on file  Social History Narrative  . Not on file    Family History  Problem Relation Age of Onset  . Hyperlipidemia Mother   . Hypertension Mother   . Heart disease Maternal Grandfather      Review of Systems  Constitutional: Negative.  Negative for chills and fever.  HENT: Negative.   Eyes: Negative.   Respiratory: Negative.  Negative for cough and shortness of breath.   Cardiovascular: Negative.  Negative for chest pain, palpitations and leg swelling.  Gastrointestinal: Negative.  Negative for abdominal pain, nausea and vomiting.  Musculoskeletal: Negative.  Negative for back pain, myalgias and neck pain.  Skin: Negative.  Negative for rash.  Neurological: Negative.  Negative for dizziness and headaches.  Endo/Heme/Allergies: Negative.   All other systems reviewed and are negative.  Vitals:   11/09/17 1459  BP: 130/82  Pulse: 83  Resp: 18  Temp: 98.7 F (37.1 C)  SpO2: 98%     Physical Exam  Constitutional: She is oriented to person, place, and time. She appears well-developed and well-nourished.  HENT:  Head: Normocephalic and atraumatic.  Mouth/Throat: Oropharynx is clear and moist.  Eyes: Pupils are equal, round, and reactive to light. EOM are normal.  Neck: Normal range of motion.  Cardiovascular: Normal rate and regular rhythm.  Pulmonary/Chest: Effort normal and breath sounds normal. Right breast exhibits no inverted nipple, no mass, no nipple discharge, no skin change and no tenderness. Left breast exhibits mass. Left breast exhibits no inverted nipple, no nipple discharge, no skin change and no tenderness.    Musculoskeletal: Normal  range of motion.  Wrists: No erythema or swelling with full range of motion.  Within normal limits. Hands: Within normal limits and full range of motion.  Normal fingers.  Neurological: She is alert and oriented to person, place, and time. No sensory deficit. She exhibits normal muscle tone.  Skin: Skin is warm and dry. Capillary refill takes less than 2 seconds.  Psychiatric: She has a normal mood and affect. Her behavior is normal.  Vitals reviewed.  A total of 25 minutes was spent in the room with the patient, greater than 50% of which was in counseling/coordination of care regarding differential diagnosis, need for diagnostic imaging, and need for follow-up.   ASSESSMENT & PLAN: Misty Williams was seen today for breast mass and wrist injury.  Diagnoses and all orders for this visit:  Breast lump -     MM Digital Screening; Future -     [place] Unlisted Procedure Breast; Future  Bilateral wrist pain    Patient Instructions       If you have lab work done today you will be contacted with your lab results within the next 2 weeks.  If you have not heard from [place] then please contact us. The fastest way to get your results is to register for My Chart.   IF you received an x-ray today, you will receive an invoice from [facility]. Please contact [facility] at [phone] with questions or concerns regarding your invoice.   IF you received labwork today, you will receive an invoice from [place]. Please contact [facility] at [phone] with questions or concerns regarding your invoice.   Our billing staff will not be able to assist you with questions regarding bills from these companies.  You will be contacted with the lab results as soon as they are available. The fastest way to get your results is to activate your My Chart account. Instructions are located on the last page of this paperwork. If you have not heard from [place] regarding the results in 2 weeks, please contact this  office.     Mammogram A mammogram is an X-ray of the breasts that is done to check for changes that are not normal. This test can screen for and find any changes that may suggest breast cancer. This test can also help to find other changes and variations in the breast. What happens before the procedure?  Have this test done about 1-2 weeks after your period. This is usually when your breasts are the least tender.  If you are visiting a new doctor or clinic, send any past mammogram images to your new doctor's office.  Wash your breasts and  under your arms the day of the test.  Do not use deodorants, perfumes, lotions, or powders on the day of the test.  Take off any jewelry from your neck.  Wear clothes that you can change into and out of easily. What happens during the procedure?  You will undress from the waist up. You will put on a gown.  You will stand in front of the X-ray machine.  Each breast will be placed between two plastic or glass plates. The plates will press down on your breast for a few seconds. Try to stay as relaxed as possible. This does not cause any harm to your breasts. Any discomfort you feel will be very brief.  X-rays will be taken from different angles of each breast. The procedure may vary among doctors and hospitals. What happens after the procedure?  The mammogram will be looked at by a specialist (radiologist).  You may need to do certain parts of the test again. This depends on the quality of the images.  Ask when your test results will be ready. Make sure you get your test results.  You may go back to your normal activities. This information is not intended to replace advice given to you by your health care provider. Make sure you discuss any questions you have with your health care provider. Document Released: 04/23/2008 Document Revised: 07/03/2015 Document Reviewed: 04/05/2014 Elsevier Interactive Patient Education  2018 Elsevier  Inc.      Edwina Barth, MD [facility]

## 2017-11-09 NOTE — Patient Instructions (Addendum)
     If you have lab work done today you will be contacted with your lab results within the next 2 weeks.  If you have not heard from us then please contact us. The fastest way to get your results is to register for My Chart.   IF you received an x-ray today, you will receive an invoice from [facility]. Please contact [facility] at [phone] with questions or concerns regarding your invoice.   IF you received labwork today, you will receive an invoice from [facility]. Please contact [facility] at [phone] with questions or concerns regarding your invoice.   Our billing staff will not be able to assist you with questions regarding bills from these companies.  You will be contacted with the lab results as soon as they are available. The fastest way to get your results is to activate your My Chart account. Instructions are located on the last page of this paperwork. If you have not heard from us regarding the results in 2 weeks, please contact this office.      Mammogram A mammogram is an X-ray of the breasts that is done to check for changes that are not normal. This test can screen for and find any changes that may suggest breast cancer. This test can also help to find other changes and variations in the breast. What happens before the procedure?  Have this test done about 1-2 weeks after your period. This is usually when your breasts are the least tender.  If you are visiting a new doctor or clinic, send any past mammogram images to your new doctor's office.  Wash your breasts and under your arms the day of the test.  Do not use deodorants, perfumes, lotions, or powders on the day of the test.  Take off any jewelry from your neck.  Wear clothes that you can change into and out of easily. What happens during the procedure?  You will undress from the waist up. You will put on a gown.  You will stand in front of the X-ray machine.  Each breast will be placed  between two plastic or glass plates. The plates will press down on your breast for a few seconds. Try to stay as relaxed as possible. This does not cause any harm to your breasts. Any discomfort you feel will be very brief.  X-rays will be taken from different angles of each breast. The procedure may vary among doctors and hospitals. What happens after the procedure?  The mammogram will be looked at by a specialist (radiologist).  You may need to do certain parts of the test again. This depends on the quality of the images.  Ask when your test results will be ready. Make sure you get your test results.  You may go back to your normal activities. This information is not intended to replace advice given to you by your health care provider. Make sure you discuss any questions you have with your health care provider. Document Released: 04/23/2008 Document Revised: 07/03/2015 Document Reviewed: 04/05/2014 Elsevier Interactive Patient Education  2018 Elsevier Inc.  

## 2017-11-23 ENCOUNTER — Ambulatory Visit
Admission: RE | Admit: 2017-11-23 | Discharge: 2017-11-23 | Disposition: A | Discharge status: Home / Self Care | Payer: BLUE CROSS/BLUE SHIELD | Source: Ambulatory Visit | Attending: Emergency Medicine | Admitting: Emergency Medicine

## 2017-11-23 DIAGNOSIS — N6320 Unspecified lump in the left breast, unspecified quadrant: Secondary | ICD-10-CM | POA: Diagnosis not present

## 2017-11-23 DIAGNOSIS — N63 Unspecified lump in unspecified breast: Secondary | ICD-10-CM

## 2017-11-26 ENCOUNTER — Other Ambulatory Visit: Payer: Self-pay | Admitting: Physician Assistant

## 2017-11-26 DIAGNOSIS — Z3041 Encounter for surveillance of contraceptive pills: Secondary | ICD-10-CM

## 2017-11-28 ENCOUNTER — Other Ambulatory Visit: Payer: Self-pay | Admitting: Physician Assistant

## 2017-11-28 DIAGNOSIS — Z3041 Encounter for surveillance of contraceptive pills: Secondary | ICD-10-CM

## 2017-12-01 ENCOUNTER — Other Ambulatory Visit: Payer: Self-pay | Admitting: *Deleted

## 2017-12-01 ENCOUNTER — Telehealth: Payer: Self-pay | Admitting: Emergency Medicine

## 2017-12-01 DIAGNOSIS — Z3041 Encounter for surveillance of contraceptive pills: Secondary | ICD-10-CM

## 2017-12-01 NOTE — Telephone Encounter (Signed)
Dr Alvy Bimler is it ok to give a one year supply. Please advise

## 2017-12-01 NOTE — Telephone Encounter (Signed)
Copied from CRM [id number]. Topic: General - Other >> Dec 01, 2017  2:19 PM Jaquita Rector A wrote: Reason for CRM: Patient called to say that in the past her Rx for JUNEL FE 1/20 1-20 MG-MCG tablet was sent to the pharmacy for a full year but on this recent visit she was given just enough for 1 month. She is asking can Dr. Alvy Bimler send in a new Rx to the pharmacy for a full year supply. And can she get a call back when this is done. Ph# [phone]

## 2017-12-01 NOTE — Telephone Encounter (Signed)
Dr. Leretha Pol refilled her prescription on 11/30/2017 for one month and specified she needs OV for further refills. Thanks.

## 2017-12-01 NOTE — Telephone Encounter (Signed)
Spoke to patient to let her know Dr Leretha Pol refilled JUNEL FE for one month and specified she needs OV for further refill. Patient stated she was here just the early part of this month (October).

## 2017-12-26 MED ORDER — NORETHIN ACE-ETH ESTRAD-FE 1-20 MG-MCG PO TABS: 1.0000 | ORAL_TABLET | Freq: Every day | ORAL | 0 refills | Status: DC

## 2017-12-26 NOTE — Addendum Note (Signed)
Addended by: Amado CoeARMSTRONG, Alyxandra Tenbrink N on: 12/26/2017 04:44 PM   Modules accepted: Orders

## 2017-12-26 NOTE — Telephone Encounter (Signed)
Pt wanted to know if she can have her JUNEL FE 1/20 1-20 MG-MCG tablet refilled once more. She has an OV scheduled for 01/09/18. Please advise  [facility] [address] - [place], [place] - [id number] [phone] (Phone) [phone] (Fax)

## 2018-01-09 ENCOUNTER — Encounter: Payer: Self-pay | Admitting: Emergency Medicine

## 2018-01-09 ENCOUNTER — Ambulatory Visit: Payer: BLUE CROSS/BLUE SHIELD | Admitting: Emergency Medicine

## 2018-01-09 ENCOUNTER — Other Ambulatory Visit: Payer: Self-pay

## 2018-01-09 DIAGNOSIS — Z3041 Encounter for surveillance of contraceptive pills: Secondary | ICD-10-CM | POA: Diagnosis not present

## 2018-01-09 MED ORDER — NORETHIN ACE-ETH ESTRAD-FE 1-20 MG-MCG PO TABS: 1.0000 | ORAL_TABLET | Freq: Every day | ORAL | 11 refills | Status: DC

## 2018-01-09 NOTE — Progress Notes (Signed)
Misty Williams 22 y.o.   Chief Complaint  Patient presents with  . Medication Refill    birth control refill     HISTORY OF PRESENT ILLNESS: This is a 22 y.o. female here today for medication refill, birth control pills.  Has no complaints or medical concerns today.  Non-smoker.  No history of DVT.  Doing well. Seen by me on 11/09/2017 with a breast lump on her left side.  Breast ultrasound showed no signs of malignancy.  No concerns.  HPI   Prior to Admission medications   Medication Sig Start Date End Date Taking? Authorizing Provider  norethindrone-ethinyl estradiol (JUNEL FE 1/20) 1-20 MG-MCG tablet Take 1 tablet by mouth daily. 01/09/18  Yes Georgina Quint, MD    Allergies  Allergen Reactions  . Penicillins Rash    There are no active problems to display for this patient.   Past Medical History:  Diagnosis Date  . IBS (irritable bowel syndrome)     History reviewed. No pertinent surgical history.  Social History   Socioeconomic History  . Marital status: Single    Spouse name: Not on file  . Number of children: Not on file  . Years of education: Not on file  . Highest education level: Not on file  Occupational History  . Not on file  Social Needs  . Financial resource strain: Not on file  . Food insecurity:    Worry: Not on file    Inability: Not on file  . Transportation needs:    Medical: Not on file    Non-medical: Not on file  Tobacco Use  . Smoking status: Never Smoker  . Smokeless tobacco: Never Used  Substance and Sexual Activity  . Alcohol use: Not on file  . Drug use: No  . Sexual activity: Not on file  Lifestyle  . Physical activity:    Days per week: Not on file    Minutes per session: Not on file  . Stress: Not on file  Relationships  . Social connections:    Talks on phone: Not on file    Gets together: Not on file    Attends religious service: Not on file    Active member of club or organization: Not on file    Attends  meetings of clubs or organizations: Not on file    Relationship status: Not on file  . Intimate partner violence:    Fear of current or ex partner: Not on file    Emotionally abused: Not on file    Physically abused: Not on file    Forced sexual activity: Not on file  Other Topics Concern  . Not on file  Social History Narrative  . Not on file    Family History  Problem Relation Age of Onset  . Hyperlipidemia Mother   . Hypertension Mother   . Heart disease Maternal Grandfather      Review of Systems  Constitutional: Negative.  Negative for chills and fever.  Respiratory: Negative for shortness of breath.   Cardiovascular: Negative for chest pain and palpitations.  Gastrointestinal: Negative for abdominal pain.  Skin: Negative for rash.  Neurological: Negative for dizziness and headaches.      Vitals:   01/09/18 1004  BP: 113/73  Pulse: 89  Temp: 98.4 F (36.9 C)  SpO2: 97%    Physical Exam  Constitutional: She is oriented to person, place, and time. She appears well-developed and well-nourished.  HENT:  Head: Normocephalic and atraumatic.  Eyes:  Pupils are equal, round, and reactive to light. EOM are normal.  Neck: Normal range of motion. Neck supple.  Cardiovascular: Normal rate.  Pulmonary/Chest: Effort normal.  Neurological: She is alert and oriented to person, place, and time.  Skin: Skin is warm and dry.  Psychiatric: She has a normal mood and affect. Her behavior is normal.  Vitals reviewed.    ASSESSMENT & PLAN: Misty Williams was seen today for medication refill.  Diagnoses and all orders for this visit:  Encounter for surveillance of contraceptive pills -     norethindrone-ethinyl estradiol (JUNEL FE 1/20) 1-20 MG-MCG tablet; Take 1 tablet by mouth daily.    Patient Instructions       If you have lab work done today you will be contacted with your lab results within the next 2 weeks.  If you have not heard from [place] then please contact us. The  fastest way to get your results is to register for My Chart.   IF you received an x-ray today, you will receive an invoice from [facility]. Please contact [facility] at [phone] with questions or concerns regarding your invoice.   IF you received labwork today, you will receive an invoice from [place]. Please contact [facility] at [phone] with questions or concerns regarding your invoice.   Our billing staff will not be able to assist you with questions regarding bills from these companies.  You will be contacted with the lab results as soon as they are available. The fastest way to get your results is to activate your My Chart account. Instructions are located on the last page of this paperwork. If you have not heard from [place] regarding the results in 2 weeks, please contact this office.      Hormonal Contraception Information Hormonal contraception is a type of birth control that uses hormones to prevent pregnancy. It usually involves a combination of the hormones estrogen and progesterone or only the hormone progesterone. Hormonal contraception works in these ways:  It thickens the mucus in the cervix, making it harder for sperm to enter the uterus.  It changes the lining of the uterus, making it harder for an egg to implant.  It may stop the ovaries from releasing eggs (ovulation). Some women who take hormonal contraceptives that contain only progesterone may continue to ovulate.  Hormonal contraception cannot prevent sexually transmitted infections (STIs). Pregnancy may still occur. Estrogen and progesterone contraceptives Contraceptives that use a combination of estrogen and progesterone are available in these forms:  Pill. Pills come in different combinations of hormones. They must be taken at the same time each day. Pills can affect your period, causing you to get your period once every three months or not at all.  Patch. The patch must be worn on the  lower abdomen for three weeks and then removed on the fourth.  Vaginal ring. The ring is placed in the vagina and left there for three weeks. It is then removed for one week.  Progesterone contraceptives Contraceptives that use progesterone only are available in these forms:  Pill. Pills should be taken every day of the cycle.  Intrauterine device (IUD). This device is inserted into the uterus and removed or replaced every five years or sooner.  Implant. Plastic rods are placed under the skin of the upper arm. They are removed or replaced every three years or sooner.  Injection. The injection is given once every 90 days.  What are the side effects? The side effects of estrogen and progesterone  contraceptives include:  Nausea.  Headaches.  Breast tenderness.  Bleeding or spotting between menstrual cycles.  High blood pressure (rare).  Strokes, heart attacks, or blood clots (rare)  Side effects of progesterone-only contraceptives include:  Nausea.  Headaches.  Breast tenderness.  Unpredictable menstrual bleeding.  High blood pressure (rare).  Talk to your health care provider about what side effects may affect you. Where to find more information:  Ask your health care provider for more information and resources about hormonal contraception.  U.S. Department of Health and CytogeneticistHuman Services Office on Women's Health: http://hoffman.com/www.womenshealth.gov Questions to ask:  What type of hormonal contraception is right for me?  How long should I plan to use hormonal contraception?  What are the side effects of the hormonal contraception method I choose?  How can I prevent STIs while using hormonal contraception? Contact a health care provider if:  You start taking hormonal contraceptives and you develop persistent or severe side effects. Summary  Estrogen and progesterone are hormones used in many forms of birth control.  Talk to your health care provider about what side effects  may affect you.  Hormonal contraception cannot prevent sexually transmitted infections (STIs).  Ask your health care provider for more information and resources about hormonal contraception. This information is not intended to replace advice given to you by your health care provider. Make sure you discuss any questions you have with your health care provider. Document Released: 02/14/2007 Document Revised: 12/26/2015 Document Reviewed: 12/26/2015 Elsevier Interactive Patient Education  2018 Elsevier Inc.      Edwina BarthMiguel Kassi Esteve, MD [facility]

## 2018-01-09 NOTE — Patient Instructions (Addendum)
If you have lab work done today you will be contacted with your lab results within the next 2 weeks.  If you have not heard from us then please contact us. The fastest way to get your results is to register for My Chart.   IF you received an x-ray today, you will receive an invoice from [facility]. Please contact [facility] at [phone] with questions or concerns regarding your invoice.   IF you received labwork today, you will receive an invoice from [facility]. Please contact [facility] at [phone] with questions or concerns regarding your invoice.   Our billing staff will not be able to assist you with questions regarding bills from these companies.  You will be contacted with the lab results as soon as they are available. The fastest way to get your results is to activate your My Chart account. Instructions are located on the last page of this paperwork. If you have not heard from us regarding the results in 2 weeks, please contact this office.      Hormonal Contraception Information Hormonal contraception is a type of birth control that uses hormones to prevent pregnancy. It usually involves a combination of the hormones estrogen and progesterone or only the hormone progesterone. Hormonal contraception works in these ways:  It thickens the mucus in the cervix, making it harder for sperm to enter the uterus.  It changes the lining of the uterus, making it harder for an egg to implant.  It may stop the ovaries from releasing eggs (ovulation). Some women who take hormonal contraceptives that contain only progesterone may continue to ovulate.  Hormonal contraception cannot prevent sexually transmitted infections (STIs). Pregnancy may still occur. Estrogen and progesterone contraceptives Contraceptives that use a combination of estrogen and progesterone are available in these forms:  Pill. Pills come in different combinations of hormones. They must be taken  at the same time each day. Pills can affect your period, causing you to get your period once every three months or not at all.  Patch. The patch must be worn on the lower abdomen for three weeks and then removed on the fourth.  Vaginal ring. The ring is placed in the vagina and left there for three weeks. It is then removed for one week.  Progesterone contraceptives Contraceptives that use progesterone only are available in these forms:  Pill. Pills should be taken every day of the cycle.  Intrauterine device (IUD). This device is inserted into the uterus and removed or replaced every five years or sooner.  Implant. Plastic rods are placed under the skin of the upper arm. They are removed or replaced every three years or sooner.  Injection. The injection is given once every 90 days.  What are the side effects? The side effects of estrogen and progesterone contraceptives include:  Nausea.  Headaches.  Breast tenderness.  Bleeding or spotting between menstrual cycles.  High blood pressure (rare).  Strokes, heart attacks, or blood clots (rare)  Side effects of progesterone-only contraceptives include:  Nausea.  Headaches.  Breast tenderness.  Unpredictable menstrual bleeding.  High blood pressure (rare).  Talk to your health care provider about what side effects may affect you. Where to find more information:  Ask your health care provider for more information and resources about hormonal contraception.  U.S. Department of Health and CytogeneticistHuman Services Office on Women's Health: http://hoffman.com/www.womenshealth.gov Questions to ask:  What type of hormonal contraception is right for me?  How long should I plan to  use hormonal contraception?  What are the side effects of the hormonal contraception method I choose?  How can I prevent STIs while using hormonal contraception? Contact a health care provider if:  You start taking hormonal contraceptives and you develop persistent or  severe side effects. Summary  Estrogen and progesterone are hormones used in many forms of birth control.  Talk to your health care provider about what side effects may affect you.  Hormonal contraception cannot prevent sexually transmitted infections (STIs).  Ask your health care provider for more information and resources about hormonal contraception. This information is not intended to replace advice given to you by your health care provider. Make sure you discuss any questions you have with your health care provider. Document Released: 02/14/2007 Document Revised: 12/26/2015 Document Reviewed: 12/26/2015 Elsevier Interactive Patient Education  [employer].

## 2018-11-21 ENCOUNTER — Encounter: Payer: Self-pay | Admitting: Physician Assistant

## 2018-11-21 ENCOUNTER — Other Ambulatory Visit: Payer: Self-pay

## 2018-11-21 ENCOUNTER — Ambulatory Visit (INDEPENDENT_AMBULATORY_CARE_PROVIDER_SITE_OTHER): Payer: BC Managed Care – PPO | Admitting: Physician Assistant

## 2018-11-21 VITALS — BP 122/82 | HR 105 | Temp 98.4°F | Ht 63.0 in | Wt 128.0 lb

## 2018-11-21 DIAGNOSIS — R5383 Other fatigue: Secondary | ICD-10-CM | POA: Diagnosis not present

## 2018-11-21 DIAGNOSIS — K580 Irritable bowel syndrome with diarrhea: Secondary | ICD-10-CM

## 2018-11-21 DIAGNOSIS — R55 Syncope and collapse: Secondary | ICD-10-CM | POA: Diagnosis not present

## 2018-11-21 DIAGNOSIS — Z3041 Encounter for surveillance of contraceptive pills: Secondary | ICD-10-CM | POA: Diagnosis not present

## 2018-11-21 LAB — CBC WITH DIFFERENTIAL/PLATELET
Basophils Absolute: 0 10*3/uL (ref 0.0–0.1)
Basophils Relative: 0.4 % (ref 0.0–3.0)
Eosinophils Absolute: 0.2 10*3/uL (ref 0.0–0.7)
Eosinophils Relative: 3.3 % (ref 0.0–5.0)
HCT: 41.7 % (ref 36.0–46.0)
Hemoglobin: 14.3 g/dL (ref 12.0–15.0)
Lymphocytes Relative: 36.5 % (ref 12.0–46.0)
Lymphs Abs: 1.9 10*3/uL (ref 0.7–4.0)
MCHC: 34.4 g/dL (ref 30.0–36.0)
MCV: 92.9 fl (ref 78.0–100.0)
Monocytes Absolute: 0.3 10*3/uL (ref 0.1–1.0)
Monocytes Relative: 5.1 % (ref 3.0–12.0)
Neutro Abs: 2.9 10*3/uL (ref 1.4–7.7)
Neutrophils Relative %: 54.7 % (ref 43.0–77.0)
Platelets: 239 10*3/uL (ref 150.0–400.0)
RBC: 4.49 Mil/uL (ref 3.87–5.11)
RDW: 12 % (ref 11.5–15.5)
WBC: 5.3 10*3/uL (ref 4.0–10.5)

## 2018-11-21 LAB — COMPREHENSIVE METABOLIC PANEL
ALT: 15 U/L (ref 0–35)
AST: 21 U/L (ref 0–37)
Albumin: 4.5 g/dL (ref 3.5–5.2)
Alkaline Phosphatase: 43 U/L (ref 39–117)
BUN: 9 mg/dL (ref 6–23)
CO2: 25 mEq/L (ref 19–32)
Calcium: 9.7 mg/dL (ref 8.4–10.5)
Chloride: 105 mEq/L (ref 96–112)
Creatinine, Ser: 0.79 mg/dL (ref 0.40–1.20)
GFR: 88.44 mL/min (ref >60.00)
Glucose, Bld: 77 mg/dL (ref 70–99)
Potassium: 3.8 mEq/L (ref 3.5–5.1)
Sodium: 141 mEq/L (ref 135–145)
Total Bilirubin: 0.7 mg/dL (ref 0.2–1.2)
Total Protein: 7.1 g/dL (ref 6.0–8.3)

## 2018-11-21 LAB — TSH: TSH: 1.12 u[IU]/mL (ref 0.35–4.50)

## 2018-11-21 MED ORDER — NORETHIN ACE-ETH ESTRAD-FE 1-20 MG-MCG PO TABS: 1.0000 | ORAL_TABLET | Freq: Every day | ORAL | 11 refills | Status: DC

## 2018-11-21 NOTE — Assessment & Plan Note (Signed)
Overall well controlled per patient.  We will continue to monitor as needed.

## 2018-11-21 NOTE — Assessment & Plan Note (Signed)
EKG tracing is personally reviewed.  EKG notes NSR.  No acute changes.  She states that this is never been worked up before, so we will send to cardiology for further evaluation and treatment.  I recommended that she did not do any exertional activity in the meantime.  Worsening precautions were advised in the interim.  We will also check labs today.

## 2018-11-21 NOTE — Assessment & Plan Note (Signed)
Unclear etiology.  Suspect likely multifactorial, given current work and school life demands, limited hydration throughout the day.  We will check labs to rule out other organic cause.  We will have her follow-up cardiology evaluation if symptoms persist.

## 2018-11-21 NOTE — Progress Notes (Signed)
New Patient Office Visit  Subjective:  Patient ID: Misty Williams, female    DOB: Apr 26, 1995  Age: 23 y.o. MRN: [id number]  CC:  Chief Complaint  Patient presents with  . check up  . pt needs bc refilled    HPI Misty Williams presents to establish care.  Oral contraceptive surveillance --patient reports that she is on Junel FE 1/20.  She states that she tolerates this medication well and has been on it since she was a teenager.  She has very light periods that last 2 days, and has no issues with excessive cramping or uncontrolled bleeding.  Patient's last menstrual period was 11/19/2018.   IBS -- patient reports that she has IBS with predominantly diarrhea.  She states that she has had some ongoing lower abdominal cramping at times.  This is not new for her.  Overall her symptoms are well managed, and do not interfere with her life.  Denies any unintentional weight loss, rectal bleeding, abnormal lymph nodes.  She states that her sister was recently diagnosed with fatty liver, and has had some GI issues.  Her patient is obese, and has poor diet.  Patient reports that she has no of the symptoms that her sister has, however she is worried that there could be something wrong with her liver 2.  She denies excessive alcohol intake or use a lot of Tylenol.  Fatigue --patient reports ongoing fatigue, with worsening fatigue over the past 2 months.  She is currently in school and working.  She states that she gets adequate sleep, and does not have any issues with falling or staying asleep.  She does having anxiety associated with her current workload.  She does not exercise regularly but does work at a [employer] so she does get a lot of steps on her feet  Fainting spells --patient reports that every time she does any excessive physical activity she either has a presyncopal event or other syncopal event.  This is been going on since high school.  She states that whenever happened in high school, it was  typically associated with her.,  And she would just go see the school nurse.  She is unable to give me many details about this, states that she would be "out of it for about anywhere between 5 and 30 minutes."  Denies any prior cardiology or neurology work-up for this.  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  She does get headaches, but overall states that these are well controlled and have not changed.  She denies chest pain, shortness of breath, lower leg swelling.  She does state that she has a second cousin who she thinks died around age 33 from "enlarged heart."   History reviewed. No pertinent past medical history.  History reviewed. No pertinent surgical history.  Family History  Problem Relation Age of Onset  . Hyperlipidemia Mother   . Hypertension Mother   . Heart disease Maternal Grandfather     Social History   Socioeconomic History  . Marital status: Single    Spouse name: Not on file  . Number of children: Not on file  . Years of education: Not on file  . Highest education level: Not on file  Occupational History  . Not on file  Social Needs  . Financial resource strain: Not on file  . Food insecurity    Worry: Not on  file    Inability: Not on file  . Transportation needs    Medical: Not on file    Non-medical: Not on file  Tobacco Use  . Smoking status: Never Smoker  . Smokeless tobacco: Never Used  Substance and Sexual Activity  . Alcohol use: Not on file  . Drug use: No  . Sexual activity: Not on file  Lifestyle  . Physical activity    Days per week: Not on file    Minutes per session: Not on file  . Stress: Not on file  Relationships  . Social Herbalist on phone: Not on file    Gets together: Not on file    Attends religious service: Not on file    Active member of club or organization: Not on file    Attends meetings of clubs or organizations: Not on file     Relationship status: Not on file  . Intimate partner violence    Fear of current or ex partner: Not on file    Emotionally abused: Not on file    Physically abused: Not on file    Forced sexual activity: Not on file  Other Topics Concern  . Not on file  Social History Narrative  . Not on file    ROS Review of Systems  Constitutional: Positive for fatigue. Negative for activity change, appetite change, chills, diaphoresis, fever and unexpected weight change.  Respiratory: Negative for cough, chest tightness and shortness of breath.   Cardiovascular: Negative for chest pain and palpitations.  Gastrointestinal: Positive for abdominal pain and diarrhea. Negative for constipation.  Neurological: Positive for syncope and headaches. Negative for dizziness and weakness.    Objective:   Today's Vitals: BP 122/82   Pulse (!) 105   Temp 98.4 F (36.9 C)   Ht 5\' 3"  (1.6 m)   Wt 128 lb (58.1 kg)   LMP 11/19/2018   SpO2 98%   BMI 22.67 kg/m   Physical Exam Vitals signs and nursing note reviewed.  Constitutional:      General: She is not in acute distress.    Appearance: She is well-developed. She is not ill-appearing or toxic-appearing.  Cardiovascular:     Rate and Rhythm: Normal rate and regular rhythm.     Pulses: Normal pulses.     Heart sounds: Normal heart sounds, S1 normal and S2 normal.     Comments: No LE edema Pulmonary:     Effort: Pulmonary effort is normal.     Breath sounds: Normal breath sounds.  Abdominal:     General: Abdomen is flat. Bowel sounds are normal.     Palpations: Abdomen is soft.     Tenderness: There is no abdominal tenderness. There is no guarding. Negative signs include Murphy's sign.  Skin:    General: Skin is warm and dry.  Neurological:     Mental Status: She is alert.     GCS: GCS eye subscore is 4. GCS verbal subscore is 5. GCS motor subscore is 6.  Psychiatric:        Speech: Speech normal.        Behavior: Behavior normal. Behavior is  cooperative.     Assessment & Plan:   Problem List Items Addressed This Visit      Cardiovascular and Mediastinum   Syncope - Primary    EKG tracing is personally reviewed.  EKG notes NSR.  No acute changes.  She states that this is never been worked up before,  so we will send to cardiology for further evaluation and treatment.  I recommended that she did not do any exertional activity in the meantime.  Worsening precautions were advised in the interim.  We will also check labs today.      Relevant Orders   EKG 12-Lead (Completed)   CBC with Differential/Platelet   Comprehensive metabolic panel   TSH   Ambulatory referral to Cardiology     Digestive   Irritable bowel syndrome with diarrhea    Overall well controlled per patient.  We will continue to monitor as needed.        Other   Oral contraceptive pill surveillance    Refill her Junel 1/20 medication today.      Fatigue    Unclear etiology.  Suspect likely multifactorial, given current work and school life demands, limited hydration throughout the day.  We will check labs to rule out other organic cause.  We will have her follow-up cardiology evaluation if symptoms persist.      Relevant Orders   CBC with Differential/Platelet   Comprehensive metabolic panel   TSH   Ambulatory referral to Cardiology    Other Visit Diagnoses    Encounter for surveillance of contraceptive pills       Relevant Medications   norethindrone-ethinyl estradiol (JUNEL FE 1/20) 1-20 MG-MCG tablet      Outpatient Encounter Medications as of 11/21/2018  Medication Sig  . norethindrone-ethinyl estradiol (JUNEL FE 1/20) 1-20 MG-MCG tablet Take 1 tablet by mouth daily.  . [DISCONTINUED] norethindrone-ethinyl estradiol (JUNEL FE 1/20) 1-20 MG-MCG tablet Take 1 tablet by mouth daily.   No facility-administered encounter medications on file as of 11/21/2018.    I spent 45 minutes with this patient, greater than 50% was face-to-face time  counseling regarding the above diagnoses.   Follow-up: No follow-ups on file.   Jarold MottoSamantha Ival Pacer, [place]

## 2018-11-21 NOTE — Assessment & Plan Note (Signed)
Refill her Junel 1/20 medication today.

## 2018-11-21 NOTE — Patient Instructions (Signed)
It was great to see you!  You will be contacted about your referral to cardiology. If you develop any concerning cardiac symptoms (chest pain, shortness of breath, etc) in the meantime, please go to the ER.  I will be in touch with your lab results and discuss any further recommendations at that time.  Take care,  Inda Coke PA-C

## 2018-12-13 ENCOUNTER — Ambulatory Visit: Payer: BC Managed Care – PPO | Admitting: Cardiology

## 2018-12-13 ENCOUNTER — Encounter: Payer: Self-pay | Admitting: Cardiology

## 2018-12-13 ENCOUNTER — Other Ambulatory Visit: Payer: Self-pay

## 2018-12-13 VITALS — Temp 97.7°F | Ht 63.0 in | Wt 126.0 lb

## 2018-12-13 DIAGNOSIS — Z7189 Other specified counseling: Secondary | ICD-10-CM

## 2018-12-13 DIAGNOSIS — R55 Syncope and collapse: Secondary | ICD-10-CM

## 2018-12-13 NOTE — Patient Instructions (Signed)
Medication Instructions:  Your Physician recommend you continue on your current medication as directed.    *If you need a refill on your cardiac medications before your next appointment, please call your pharmacy*  Lab Work: None  Testing/Procedures: Your physician has requested that you have an echocardiogram. Echocardiography is a painless test that uses sound waves to create images of your heart. It provides your doctor with information about the size and shape of your heart and how well your heart's chambers and valves are working. This procedure takes approximately one hour. There are no restrictions for this procedure. [address]   Follow-Up: At [facility], you and your health needs are our priority.  As part of our continuing mission to provide you with exceptional heart care, we have created designated Provider Care Teams.  These Care Teams include your primary Cardiologist (physician) and Advanced Practice Providers (APPs -  Physician Assistants and Nurse Practitioners) who all work together to provide you with the care you need, when you need it.  Your next appointment:   As needed  The format for your next appointment:   Either In Person or Virtual  Provider:   Bridgette Christopher, MD   

## 2018-12-13 NOTE — Progress Notes (Signed)
Cardiology Office Note:    Date:  12/13/2018   ID:  Misty Williams, DOB [birth year], MRN [id number]  PCP:  Inda Coke, [place]  Cardiologist:  Buford Dresser, MD  Referring MD: Inda Coke, PA   CC: new patient consultation for the evaluation of recurrent syncope  History of Present Illness:    Misty Williams is a 23 y.o. female with a hx of IBS who is seen as a new consult at the request of Inda Coke, [place] for the evaluation and management of recurrent syncope.  She was seen by Inda Coke, PA on 11/21/18 for a new patient visit. Noted to have history of recurrent syncope and referred to cardiology for further evaluation.  Syncope: Initial episode: freshman year of high school, worst through high school) Frequency: used to be more frequent during her period (better now that on birth control), used to occur with gym classes, cheering, etc nearly every time. Now much less frequent, but doesn't work out as frequently as she had before. Last episode: has been some time, likely last was spring of 2019 after yardwork. Duration of episodes: entire time from starting to feel bad to fully recovered can be 2-3 hours. Actual loss of consciousness only moments. Presyncopal symptoms: fast heart rate, migraine. If she naps, takes a cold shower, she can prevent syncope Post syncope symptoms: feels tired, not confused.  Aggravating/alleviating factors: worse with activity, menses, heat. Better with rest, cold shower. Pre-existing medical conditions: IBS, fatigue Potential medication/supplement interactions: none other than listed Prior workup: none ECG: NSR/SA Family history: maternal second cousin who died of enlarged heart age 34. Thinks she was told it was HCM. Dad has a extra heartbeat (was adopted--limited paternal history). Mat GMa and Mat Gpa with HF, Gpa with either MI or CVA. Aunt had CVA, deceased at age 84. Both parents have HTN and HLD. Sister with exercise induced asthma.  Notes that when she does exercise, her HR can go to 220 based on her wrist fitness activity. Mostly sedentary, notes that she only exercises about 1x/month.  History reviewed. No pertinent past medical history.  History reviewed. No pertinent surgical history.  Current Medications: Current Outpatient Medications on File Prior to Visit  Medication Sig  . norethindrone-ethinyl estradiol (JUNEL FE 1/20) 1-20 MG-MCG tablet Take 1 tablet by mouth daily.   No current facility-administered medications on file prior to visit.      Allergies:   Penicillins   Social History   Tobacco Use  . Smoking status: Never Smoker  . Smokeless tobacco: Never Used  Substance Use Topics  . Alcohol use: Not on file  . Drug use: No    Family History: family history includes Heart disease in her maternal grandfather; Hyperlipidemia in her mother; Hypertension in her mother.  ROS:   Please see the history of present illness.  Additional pertinent ROS: Constitutional: Negative for chills, fever, night sweats, unintentional weight loss  HENT: Negative for ear pain and hearing loss.   Eyes: Negative for loss of vision and eye pain.  Respiratory: Negative for cough, sputum, wheezing.   Cardiovascular: See HPI. Gastrointestinal: Negative for abdominal pain, melena, and hematochezia.  Genitourinary: Negative for dysuria and hematuria.  Musculoskeletal: Negative for falls and myalgias.  Skin: Negative for itching and rash.  Neurological: Negative for focal weakness, focal sensory changes and loss of consciousness.  Endo/Heme/Allergies: Does not bruise/bleed easily.     EKGs/Labs/Other Studies Reviewed:    The following studies were reviewed today: No prior cardiac studies  EKG:  EKG is personally reviewed.  The ekg ordered today demonstrates sinus rhythm with sinus arrhythmia  Recent Labs: 11/21/2018: ALT 15; BUN 9; Creatinine, Ser 0.79; Hemoglobin 14.3; Platelets 239.0; Potassium 3.8; Sodium 141;  TSH 1.12  Recent Lipid Panel No results found for: CHOL, TRIG, HDL, CHOLHDL, VLDL, LDLCALC, LDLDIRECT  Physical Exam:    VS:  Temp 97.7 F (36.5 C)   Ht 5\' 3"  (1.6 m)   Wt 126 lb (57.2 kg)   LMP 11/19/2018   SpO2 99%   BMI 22.32 kg/m    Orthostatics: 126/87, 80 supine 131/80, 82 sitting 133/88, 91 standing  Wt Readings from Last 3 Encounters:  12/13/18 126 lb (57.2 kg)  11/21/18 128 lb (58.1 kg)  01/09/18 127 lb 9.6 oz (57.9 kg)    GEN: Well nourished, well developed in no acute distress HEENT: Normal, moist mucous membranes NECK: No JVD CARDIAC: regular rhythm, normal S1 and S2, no rubs or gallops. No murmurs. VASCULAR: Radial and DP pulses 2+ bilaterally. No carotid bruits RESPIRATORY:  Clear to auscultation without rales, wheezing or rhonchi  ABDOMEN: Soft, non-tender, non-distended MUSCULOSKELETAL:  Ambulates independently SKIN: Warm and dry, no edema NEUROLOGIC:  Alert and oriented x 3. No focal neuro deficits noted. PSYCHIATRIC:  Normal affect    ASSESSMENT:    1. Syncope, unspecified syncope type   2. Cardiac risk counseling   3. Counseling on health promotion and disease prevention    PLAN:    Syncope: we spent >40 minutes today discussing multiple potential causes of syncope today.  -orthostatic vital signs not consistent with POTS -discussed the autonomic nervous system at length, and we reviewed the reflex that can cause syncope -counseled on lifestyle habits that can help prevent this: -avoid dehydration. Oral rehydration is preferred, and routine use of IV fluids is not recommended. -if tolerated, compression stocking can assist with fluid management and prevent pooling in the legs. -slow position changes are recommended -if there is a feeling of severe lightheadedness, like near to passing out, recommend lying on the floor on the back, with legs elevated up on a chair or up against the wall. -recommend gradual exercise conditioning. I recommend seated  exercises such as bike to start, to avoid the risk of falling with lightheadedness.   -will get echocardiogram to rule out structural abnormalities. No high risk features on ECG.  -discussed low yield of typical monitors with infrequent/difficult to predict/brief symptoms. If events increase in frequency, can reassess -often symptoms improve with age. However, if they worsen, I asked her to contact me so that we can do additional evaluation.  Cardiac risk counseling and prevention recommendations: -recommend heart healthy/Mediterranean diet, with whole grains, fruits, vegetable, fish, lean meats, nuts, and olive oil. Limit salt. -recommend moderate walking, 3-5 times/week for 30-50 minutes each session. Aim for at least 150 minutes.week. Goal should be pace of 3 miles/hours, or walking 1.5 miles in 30 minutes -recommend avoidance of tobacco products. Avoid excess alcohol. -ASCVD risk score: The ASCVD Risk score 14/02/19 DC Jr., et al., 2013) failed to calculate for the following reasons:   The 2013 ASCVD risk score is only valid for ages 106 to 28    Plan for follow up: as needed  Medication Adjustments/Labs and Tests Ordered: Current medicines are reviewed at length with the patient today.  Concerns regarding medicines are outlined above.  Orders Placed This Encounter  Procedures  . EKG 12-Lead  . ECHOCARDIOGRAM COMPLETE   No orders of the defined types were  placed in this encounter.   Patient Instructions  Medication Instructions:  Your Physician recommend you continue on your current medication as directed.    *If you need a refill on your cardiac medications before your next appointment, please call your pharmacy*  Lab Work: None  Testing/Procedures: Your physician has requested that you have an echocardiogram. Echocardiography is a painless test that uses sound waves to create images of your heart. It provides your doctor with information about the size and shape of your heart and  how well your heart's chambers and valves are working. This procedure takes approximately one hour. There are no restrictions for this procedure. [address]   Follow-Up: At [facility], you and your health needs are our priority.  As part of our continuing mission to provide you with exceptional heart care, we have created designated Provider Care Teams.  These Care Teams include your primary Cardiologist (physician) and Advanced Practice Providers (APPs -  Physician Assistants and Nurse Practitioners) who all work together to provide you with the care you need, when you need it.  Your next appointment:   As needed  The format for your next appointment:   Either In Williams or Virtual  Provider:   Jodelle RedBridgette Noell Lorensen, MD     Signed, Jodelle RedBridgette Shemekia Patane, MD PhD 12/13/2018  [facility]

## 2018-12-19 ENCOUNTER — Other Ambulatory Visit (HOSPITAL_COMMUNITY): Payer: BC Managed Care – PPO

## 2019-06-12 ENCOUNTER — Other Ambulatory Visit: Payer: Self-pay

## 2019-06-12 ENCOUNTER — Encounter: Payer: Self-pay | Admitting: Physician Assistant

## 2019-06-12 ENCOUNTER — Telehealth (INDEPENDENT_AMBULATORY_CARE_PROVIDER_SITE_OTHER): Payer: BC Managed Care – PPO | Admitting: Physician Assistant

## 2019-06-12 VITALS — Temp 98.4°F | Ht 63.0 in | Wt 115.0 lb

## 2019-06-12 DIAGNOSIS — R0981 Nasal congestion: Secondary | ICD-10-CM

## 2019-06-12 MED ORDER — DOXYCYCLINE HYCLATE 100 MG PO TABS: 100.0000 mg | ORAL_TABLET | Freq: Two times a day (BID) | ORAL | 0 refills | Status: DC

## 2019-06-12 NOTE — Progress Notes (Signed)
   TELEPHONE ENCOUNTER   Patient verbally agreed to telephone visit and is aware that copayment and coinsurance may apply. Patient was treated using telemedicine according to accepted telemedicine protocols.  Location of the patient: home Location of provider: [place] [address] [employer] of all persons participating in the telemedicine service and role in the encounter: Jarold Motto, PA , Caryln Felipe  Subjective:   Chief Complaint  Patient presents with  . Sinus Problem     HPI   Sinus problem Pt c/o sinus pressure and slight headache, facial pain, some nasal congestion since Saturday. Pt also c/o fullness in both ears R> L, took Benadryl last night. Low grade temp last night 99.3.  Used to get sinus infections regularly.  Started a nasal spray -- [employer] branded flonase with minimal relief.  Denies: fevers, cough, sore throat  Patient Active Problem List   Diagnosis Date Noted  . Syncope 11/21/2018  . Oral contraceptive pill surveillance 11/21/2018  . Fatigue 11/21/2018  . Irritable bowel syndrome with diarrhea 11/21/2018   Social History   Tobacco Use  . Smoking status: Never Smoker  . Smokeless tobacco: Never Used  Substance Use Topics  . Alcohol use: Not on file    Current Outpatient Medications:  .  norethindrone-ethinyl estradiol (JUNEL FE 1/20) 1-20 MG-MCG tablet, Take 1 tablet by mouth daily., Disp: 28 tablet, Rfl: 11 Allergies  Allergen Reactions  . Penicillins Rash    Assessment & Plan:   1. Sinus congestion    No red flags on exam.  Recommend continued nasal spray and wait 7-10 days from onset of symptoms until starting antibiotic if able. Did send in pocket prescription of doxycycline per orders. Discussed taking medications as prescribed. Reviewed return precautions including worsening fever, SOB, worsening cough or other concerns. Push fluids and rest. I recommend that patient follow-up if symptoms worsen or persist despite treatment x 7-10 days,  sooner if needed.   No orders of the defined types were placed in this encounter.  No orders of the defined types were placed in this encounter.   Jarold Motto, [place] 06/12/2019  Time spent with the patient: 6 minutes, spent in obtaining information about her symptoms, reviewing her previous labs, evaluations, and treatments, counseling her about her condition (please see the discussed topics above), and developing a plan to further investigate it; she had a number of questions which I addressed.

## 2019-10-02 ENCOUNTER — Encounter: Payer: Self-pay | Admitting: Physician Assistant

## 2019-10-02 ENCOUNTER — Other Ambulatory Visit: Payer: Self-pay

## 2019-10-02 ENCOUNTER — Telehealth (INDEPENDENT_AMBULATORY_CARE_PROVIDER_SITE_OTHER): Payer: 59 | Admitting: Physician Assistant

## 2019-10-02 DIAGNOSIS — Z3041 Encounter for surveillance of contraceptive pills: Secondary | ICD-10-CM

## 2019-10-02 MED ORDER — NORETHIN ACE-ETH ESTRAD-FE 1-20 MG-MCG PO TABS: 1.0000 | ORAL_TABLET | Freq: Every day | ORAL | 3 refills | Status: DC

## 2019-10-02 NOTE — Progress Notes (Signed)
Virtual Visit via Video   I connected with Misty Williams on 10/02/19 at 12:30 PM EDT by a video enabled telemedicine application and verified that I am speaking with the correct person using two identifiers. Location patient: Home Location provider: [facility], Office Persons participating in the virtual visit: Shenita Heimsoth, Jarold Motto PA-C, Corky Mull, LPN   I discussed the limitations of evaluation and management by telemedicine and the availability of in person appointments. The patient expressed understanding and agreed to proceed.  I acted as a Neurosurgeon for [employer], PA-C [employer], LPN   Subjective:   HPI:   Oral contraceptive pill surveillance She is currently on Junel Fe 1/20 and tolerating well. Denies concerns for pregnancy. Takes this medication regularly and has not missed any doses. Denies concerns with her periods at this time.  ROS: See pertinent positives and negatives per HPI.  Patient Active Problem List   Diagnosis Date Noted  . Syncope 11/21/2018  . Oral contraceptive pill surveillance 11/21/2018  . Fatigue 11/21/2018  . Irritable bowel syndrome with diarrhea 11/21/2018    Social History   Tobacco Use  . Smoking status: Never Smoker  . Smokeless tobacco: Never Used  Substance Use Topics  . Alcohol use: Not on file    Current Outpatient Medications:  .  doxycycline (VIBRA-TABS) 100 MG tablet, Take 1 tablet (100 mg total) by mouth 2 (two) times daily., Disp: 20 tablet, Rfl: 0 .  norethindrone-ethinyl estradiol (JUNEL FE 1/20) 1-20 MG-MCG tablet, Take 1 tablet by mouth daily., Disp: 84 tablet, Rfl: 3  Allergies  Allergen Reactions  . Penicillins Rash    Objective:   VITALS: Per patient if applicable, see vitals. GENERAL: Alert, appears well and in no acute distress. HEENT: Atraumatic, conjunctiva clear, no obvious abnormalities on inspection of external nose and ears. NECK: Normal movements of the head and  neck. CARDIOPULMONARY: No increased WOB. Speaking in clear sentences. I:E ratio WNL.  MS: Moves all visible extremities without noticeable abnormality. PSYCH: Pleasant and cooperative, well-groomed. Speech normal rate and rhythm. Affect is appropriate. Insight and judgement are appropriate. Attention is focused, linear, and appropriate.  NEURO: CN grossly intact. Oriented as arrived to appointment on time with no prompting. Moves both UE equally.  SKIN: No obvious lesions, wounds, erythema, or cyanosis noted on face or hands.  Assessment and Plan:   [name] was seen today for medication refill.  Diagnoses and all orders for this visit:  Encounter for surveillance of contraceptive pills -     norethindrone-ethinyl estradiol (JUNEL FE 1/20) 1-20 MG-MCG tablet; Take 1 tablet by mouth daily.   Doing well with this medication. Will refill x 1 year. Follow-up as needed.  . Reviewed expectations re: course of current medical issues. . Discussed self-management of symptoms. . Outlined signs and symptoms indicating need for more acute intervention. . Patient verbalized understanding and all questions were answered. Marland Kitchen Health Maintenance issues including appropriate healthy diet, exercise, and smoking avoidance were discussed with patient. . See orders for this visit as documented in the electronic medical record.  I discussed the assessment and treatment plan with the patient. The patient was provided an opportunity to ask questions and all were answered. The patient agreed with the plan and demonstrated an understanding of the instructions.   The patient was advised to call back or seek an in-person evaluation if the symptoms worsen or if the condition fails to improve as anticipated.   CMA or LPN served as Neurosurgeon during this  visit. History, Physical, and Plan performed by medical provider. The above documentation has been reviewed and is accurate and complete.  [place],  [place] 10/02/2019

## 2019-12-01 IMAGING — US ULTRASOUND LEFT BREAST LIMITED
1 series · 4 of 4 positions shown · non-contrast
Comparison: None

CLINICAL DATA: Patient presents reporting a palpable lump in the
upper outer left breast.

EXAM:
ULTRASOUND OF THE LEFT BREAST

[Series 1: ultrasound left breast limited · 0.07mm/px · 4 of 4 slices shown]
[im 1/4]
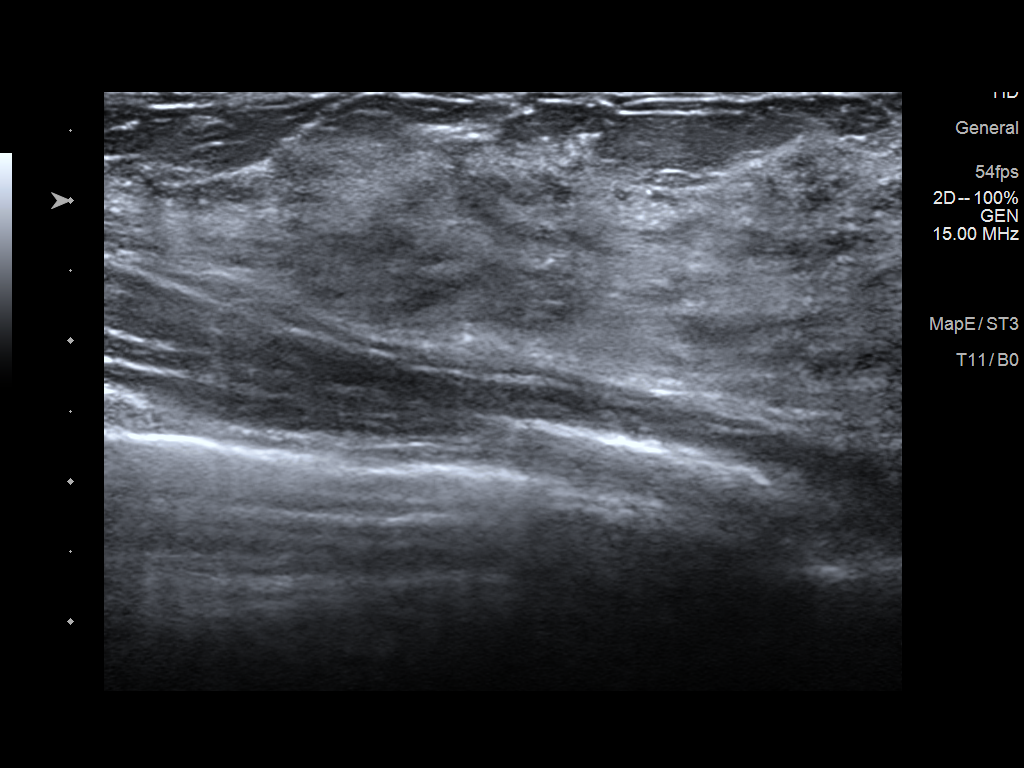
[im 2/4]
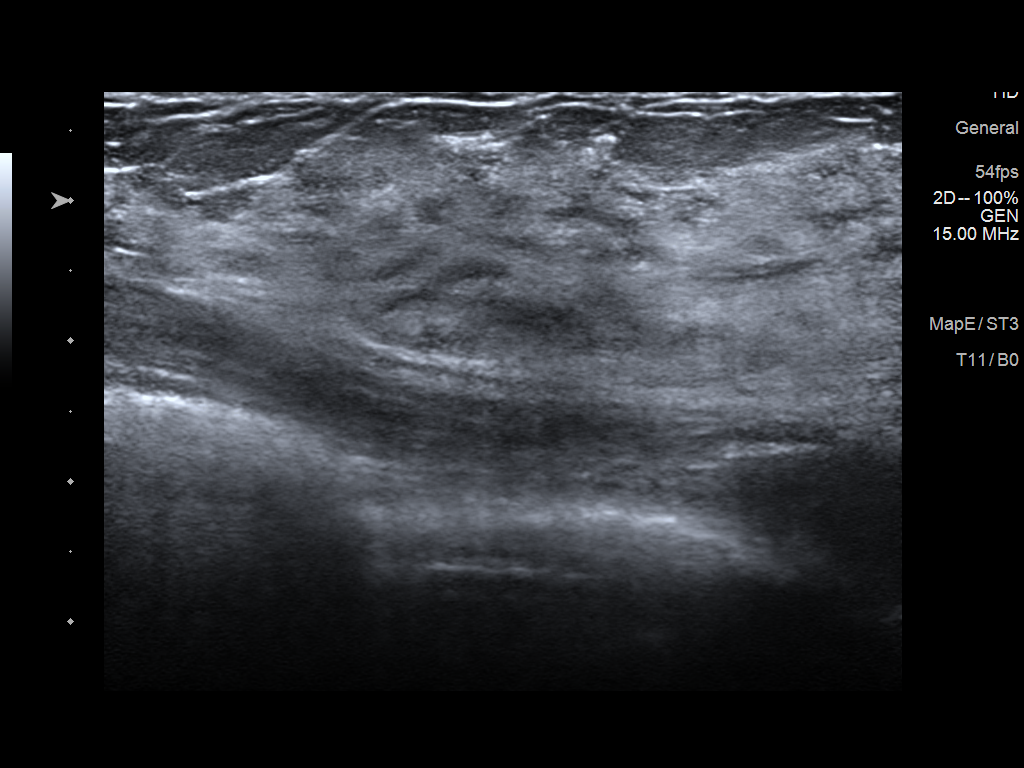
[im 3/4]
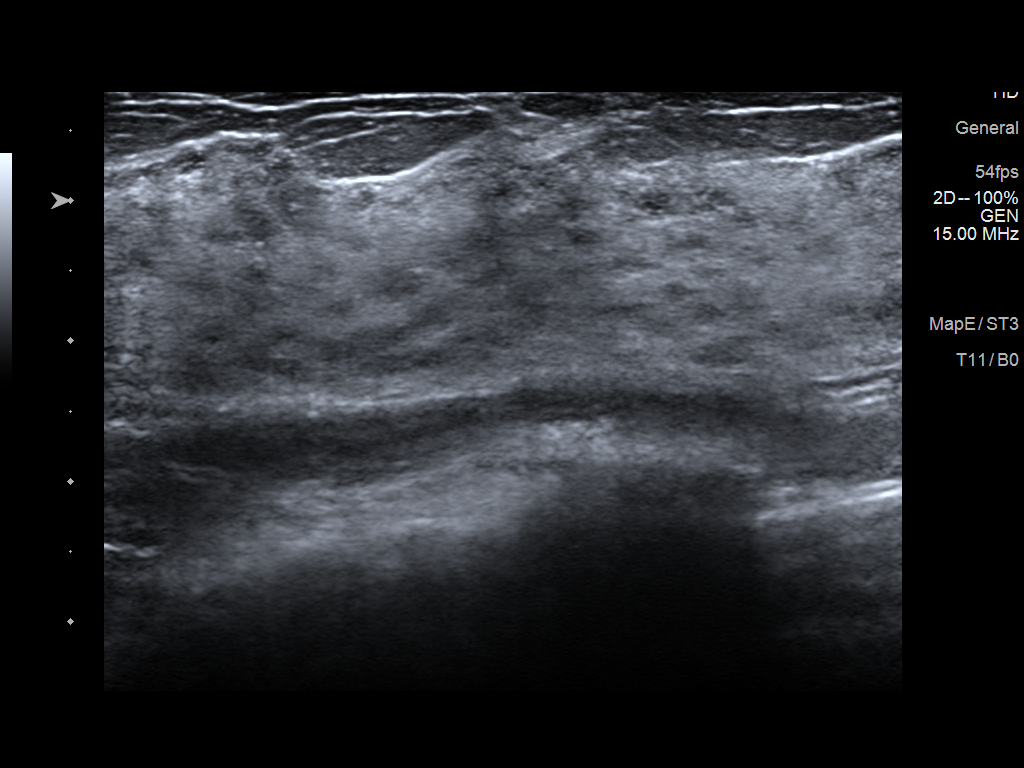
[im 4/4]
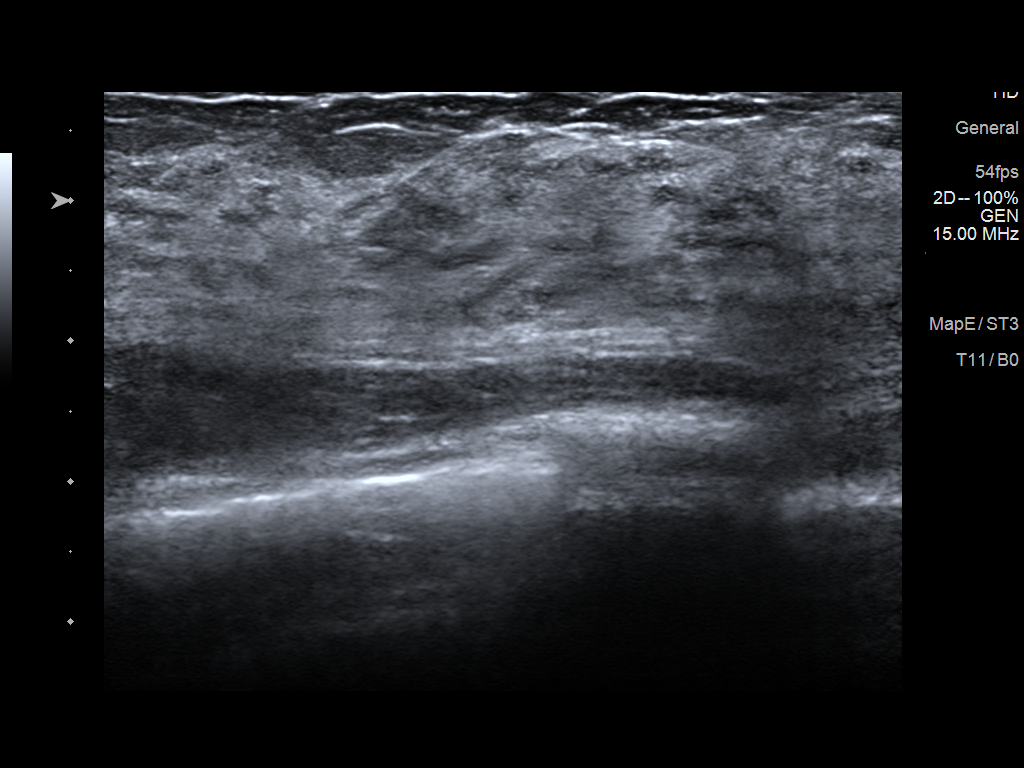

[4 of 4 positions shown; findings below may reference images not displayed]

FINDINGS: On physical exam, there is a ropey area of prominence in the upper
outer left breast corresponding to the reported palpable
abnormality.

Targeted ultrasound is performed, showing prominent fibroglandular
tissue throughout the upper outer quadrant, targeting the area of
the palpable abnormality. No mass or suspicious lesion.
IMPRESSION: No evidence of breast malignancy. The palpable abnormality
corresponds to prominent fibroglandular tissue.

RECOMMENDATION:
1. Screening mammogram at age 40 unless there are persistent or
intervening clinical concerns. (Code:29-8-LOT)
2. If the palpable abnormality appears to be enlarging, repeat
diagnostic evaluation to include mammography would be recommended.

I have discussed the findings and recommendations with the patient.
Results were also provided in writing at the conclusion of the
visit. If applicable, a reminder letter will be sent to the patient
regarding the next appointment.

BI-RADS CATEGORY  1: Negative.

## 2020-09-25 ENCOUNTER — Encounter: Payer: Self-pay | Admitting: Physician Assistant

## 2020-09-25 ENCOUNTER — Ambulatory Visit (INDEPENDENT_AMBULATORY_CARE_PROVIDER_SITE_OTHER): Payer: 59 | Admitting: Physician Assistant

## 2020-09-25 ENCOUNTER — Other Ambulatory Visit: Payer: Self-pay

## 2020-09-25 VITALS — BP 110/78 | HR 75 | Temp 98.3°F | Ht 64.0 in | Wt 113.4 lb

## 2020-09-25 DIAGNOSIS — Z114 Encounter for screening for human immunodeficiency virus [HIV]: Secondary | ICD-10-CM

## 2020-09-25 DIAGNOSIS — Z0000 Encounter for general adult medical examination without abnormal findings: Secondary | ICD-10-CM

## 2020-09-25 DIAGNOSIS — Z1159 Encounter for screening for other viral diseases: Secondary | ICD-10-CM

## 2020-09-25 DIAGNOSIS — G43109 Migraine with aura, not intractable, without status migrainosus: Secondary | ICD-10-CM | POA: Insufficient documentation

## 2020-09-25 DIAGNOSIS — Z83438 Family history of other disorder of lipoprotein metabolism and other lipidemia: Secondary | ICD-10-CM | POA: Diagnosis not present

## 2020-09-25 DIAGNOSIS — Z3041 Encounter for surveillance of contraceptive pills: Secondary | ICD-10-CM | POA: Diagnosis not present

## 2020-09-25 LAB — COMPREHENSIVE METABOLIC PANEL
ALT: 14 U/L (ref 0–35)
AST: 21 U/L (ref 0–37)
Albumin: 4.4 g/dL (ref 3.5–5.2)
Alkaline Phosphatase: 43 U/L (ref 39–117)
BUN: 11 mg/dL (ref 6–23)
CO2: 24 mEq/L (ref 19–32)
Calcium: 9.3 mg/dL (ref 8.4–10.5)
Chloride: 103 mEq/L (ref 96–112)
Creatinine, Ser: 0.85 mg/dL (ref 0.40–1.20)
GFR: 94.06 mL/min (ref >60.00)
Glucose, Bld: 90 mg/dL (ref 70–99)
Potassium: 3.8 mEq/L (ref 3.5–5.1)
Sodium: 136 mEq/L (ref 135–145)
Total Bilirubin: 0.9 mg/dL (ref 0.2–1.2)
Total Protein: 7.5 g/dL (ref 6.0–8.3)

## 2020-09-25 LAB — CBC WITH DIFFERENTIAL/PLATELET
Basophils Absolute: 0 10*3/uL (ref 0.0–0.1)
Basophils Relative: 0.3 % (ref 0.0–3.0)
Eosinophils Absolute: 0.1 10*3/uL (ref 0.0–0.7)
Eosinophils Relative: 1.7 % (ref 0.0–5.0)
HCT: 42 % (ref 36.0–46.0)
Hemoglobin: 14.3 g/dL (ref 12.0–15.0)
Lymphocytes Relative: 27.6 % (ref 12.0–46.0)
Lymphs Abs: 1.6 10*3/uL (ref 0.7–4.0)
MCHC: 34.1 g/dL (ref 30.0–36.0)
MCV: 93.5 fl (ref 78.0–100.0)
Monocytes Absolute: 0.2 10*3/uL (ref 0.1–1.0)
Monocytes Relative: 3.7 % (ref 3.0–12.0)
Neutro Abs: 4 10*3/uL (ref 1.4–7.7)
Neutrophils Relative %: 66.7 % (ref 43.0–77.0)
Platelets: 239 10*3/uL (ref 150.0–400.0)
RBC: 4.5 Mil/uL (ref 3.87–5.11)
RDW: 12 % (ref 11.5–15.5)
WBC: 5.9 10*3/uL (ref 4.0–10.5)

## 2020-09-25 LAB — LIPID PANEL
Cholesterol: 157 mg/dL (ref 0–200)
HDL: 83.2 mg/dL (ref >39.00)
LDL Cholesterol: 58 mg/dL (ref 0–99)
NonHDL: 74.13
Total CHOL/HDL Ratio: 2
Triglycerides: 83 mg/dL (ref 0.0–149.0)
VLDL: 16.6 mg/dL (ref 0.0–40.0)

## 2020-09-25 MED ORDER — NORETHINDRONE 0.35 MG PO TABS: 1.0000 | ORAL_TABLET | Freq: Every day | ORAL | 3 refills | Status: AC

## 2020-09-25 NOTE — Patient Instructions (Signed)
It was great to see you!  Schedule for pap at your convenience Switch to new birth control  Please go to the lab for blood work.   Our office will call you with your results unless you have chosen to receive results via MyChart.  If your blood work is normal we will follow-up each year for physicals and as scheduled for chronic medical problems.  If anything is abnormal we will treat accordingly and get you in for a follow-up.  Take care,  Lelon Mast

## 2020-09-25 NOTE — Progress Notes (Signed)
I acted as a Neurosurgeon for [employer], PA-C [employer], LPN   Subjective:    Misty Williams is a 25 y.o. female and is here for a comprehensive physical exam.  HPI  Health Maintenance Due  Topic Date Due   Hepatitis C Screening  Never done    Acute Concerns: None  Chronic Issues: OCP use -- having some vaginal dryness and itching, increased period pain. Has used a new foam to help with cleansing and this has been helpful. Getting married October 2023. She notes that she is having migraines with auras.  This is a new development for her.  Health Maintenance: Immunizations -- UTD Colonoscopy -- N/A Mammogram -- N/A PAP -- overdue, pt is on cycle will come back for pap smear Bone Density -- N/A Diet -- overall well balanced Sleep habits --does wake up about 1x per night but is able to fall back asleep easily Exercise -- none Current Weight -- Weight: 113 lb 6.1 oz (51.4 kg) -- continues to trend down; had GI bug and stressful work and school schedule; was down as low as 101 lb Weight History: Wt Readings from Last 10 Encounters:  09/25/20 113 lb 6.1 oz (51.4 kg)  06/12/19 115 lb (52.2 kg)  12/13/18 126 lb (57.2 kg)  11/21/18 128 lb (58.1 kg)  01/09/18 127 lb 9.6 oz (57.9 kg)  11/09/17 122 lb 9.6 oz (55.6 kg)  09/28/17 120 lb (54.4 kg)  05/23/17 120 lb (54.4 kg)  11/09/16 121 lb 9.6 oz (55.2 kg)  10/08/15 124 lb 6.4 oz (56.4 kg)   Body mass index is 19.46 kg/m. Mood --had some recent situational stress but her mood is improving  Patient's last menstrual period was 09/24/2020 (exact date).  Up-to-date with the dentist and the eye doctor    reports current alcohol use of about 7.0 - 14.0 standard drinks per week.  Tobacco Use: Low Risk    Smoking Tobacco Use: Never   Smokeless Tobacco Use: Never     Depression screen PHQ 2/9 09/25/2020  Decreased Interest 0  Down, Depressed, Hopeless 0  PHQ - 2 Score 0     Other providers/specialists: Patient Care  Team: Jarold Motto, [place] as PCP - General (Physician Assistant) Jodelle Red, MD as PCP - Cardiology (Cardiology)   PMHx, SurgHx, SocialHx, Medications, and Allergies were reviewed in the Visit Navigator and updated as appropriate.   History reviewed. No pertinent past medical history.  History reviewed. No pertinent surgical history.   Family History  Problem Relation Age of Onset   Hyperlipidemia Mother    Hypertension Mother    Breast cancer Mother 38   Hypertension Father    Hyperlipidemia Father    Heart disease Maternal Grandmother    Heart disease Maternal Grandfather    Alzheimer's disease Paternal Grandmother    Crohn's disease Maternal Aunt    Melanoma Maternal Aunt    Early death Maternal Aunt 19   Colon cancer Neg Hx     Social History   Tobacco Use   Smoking status: Never   Smokeless tobacco: Never  Substance Use Topics   Alcohol use: Yes    Alcohol/week: 7.0 - 14.0 standard drinks    Types: 7 - 14 Cans of beer per week   Drug use: No    Review of Systems:   Review of Systems  Constitutional:  Negative for chills, fever, malaise/fatigue and weight loss.  HENT:  Negative for hearing loss, sinus pain and sore throat.  Respiratory:  Negative for cough and hemoptysis.   Cardiovascular:  Negative for chest pain, palpitations, leg swelling and PND.  Gastrointestinal:  Negative for abdominal pain, constipation, diarrhea, heartburn, nausea and vomiting.  Genitourinary:  Negative for dysuria, frequency and urgency.  Musculoskeletal:  Negative for back pain, myalgias and neck pain.  Skin:  Negative for itching and rash.  Neurological:  Positive for headaches. Negative for dizziness, tingling and seizures.  Endo/Heme/Allergies:  Negative for polydipsia.  Psychiatric/Behavioral:  Negative for depression. The patient is not nervous/anxious.    Objective:   BP 110/78 (BP Location: Left Arm, Patient Position: Sitting, Cuff Size: Normal)   Pulse 75    Temp 98.3 F (36.8 C) (Temporal)   Ht 5\' 4"  (1.626 m)   Wt 113 lb 6.1 oz (51.4 kg)   LMP 09/24/2020 (Exact Date)   SpO2 98%   BMI 19.46 kg/m   General Appearance:    Alert, cooperative, no distress, appears stated age  Head:    Normocephalic, without obvious abnormality, atraumatic  Eyes:    PERRL, conjunctiva/corneas clear, EOM's intact, fundi    benign, both eyes  Ears:    Normal TM's and external ear canals, both ears  Nose:   Nares normal, septum midline, mucosa normal, no drainage    or sinus tenderness  Throat:   Lips, mucosa, and tongue normal; teeth and gums normal  Neck:   Supple, symmetrical, trachea midline, no adenopathy;    thyroid:  no enlargement/tenderness/nodules; no carotid   bruit or JVD  Back:     Symmetric, no curvature, ROM normal, no CVA tenderness  Lungs:     Clear to auscultation bilaterally, respirations unlabored  Chest Wall:    No tenderness or deformity   Heart:    Regular rate and rhythm, S1 and S2 normal, no murmur, rub   or gallop  Breast Exam:    No tenderness, masses, or nipple abnormality  Abdomen:     Soft, non-tender, bowel sounds active all four quadrants,    no masses, no organomegaly  Genitalia:    Deferred  Rectal:    Deferred  Extremities:   Extremities normal, atraumatic, no cyanosis or edema  Pulses:   2+ and symmetric all extremities  Skin:   Skin color, texture, turgor normal, no rashes or lesions  Lymph nodes:   Cervical, supraclavicular, and axillary nodes normal  Neurologic:   CNII-XII intact, normal strength, sensation and reflexes    throughout    Assessment/Plan:   Misty Williams was seen today for annual exam.  Diagnoses and all orders for this visit:  Routine physical examination Today patient counseled on age appropriate routine health concerns for screening and prevention, each reviewed and up to date or declined. Immunizations reviewed and up to date or declined. Labs ordered and reviewed. Risk factors for depression  reviewed and negative. Hearing function and visual acuity are intact. ADLs screened and addressed as needed. Functional ability and level of safety reviewed and appropriate. Education, counseling and referrals performed based on assessed risks today. Patient provided with a copy of personalized plan for preventive services.  Oral contraceptive pill surveillance; Migraine with aura and without status migrainosus, not intractable We are going to switch to Micronor given her new occurrence of migraines with auras. Patient agreeable to plan and verbalized understanding -     CBC with Differential/Platelet -     Comprehensive metabolic panel -     Cancel: Lipid panel  Screening for HIV (human immunodeficiency virus) -  HIV Antibody (routine testing w rflx)  Encounter for screening for other viral diseases -     Hepatitis C antibody  Family history of hyperlipidemia Update cholesterol to evaluate for this. -     Lipid panel  Other orders -     norethindrone (ORTHO MICRONOR) 0.35 MG tablet; Take 1 tablet (0.35 mg total) by mouth daily.    Patient Counseling:   [x]     Nutrition: Stressed importance of moderation in sodium/caffeine intake, saturated fat and cholesterol, caloric balance, sufficient intake of fresh fruits, vegetables, fiber, calcium, iron, and 1 mg of folate supplement per day (for females capable of pregnancy).   [x]      Stressed the importance of regular exercise.    [x]     Substance Abuse: Discussed cessation/primary prevention of tobacco, alcohol, or other drug use; driving or other dangerous activities under the influence; availability of treatment for abuse.    [x]      Injury prevention: Discussed safety belts, safety helmets, smoke detector, smoking near bedding or upholstery.    [x]      Sexuality: Discussed sexually transmitted diseases, partner selection, use of condoms, avoidance of unintended pregnancy  and contraceptive alternatives.    [x]     Dental  health: Discussed importance of regular tooth brushing, flossing, and dental visits.   [x]      Health maintenance and immunizations reviewed. Please refer to Health maintenance section.   CMA or LPN served as scribe during this visit. History, Physical, and Plan performed by medical provider. The above documentation has been reviewed and is accurate and complete.  , PA-C [place] [facility]

## 2020-09-26 LAB — HEPATITIS C ANTIBODY
Hepatitis C Ab: NONREACTIVE
SIGNAL TO CUT-OFF: 0.01 (ref <1.00)

## 2020-09-26 LAB — HIV ANTIBODY (ROUTINE TESTING W REFLEX): HIV 1&2 Ab, 4th Generation: NONREACTIVE

## 2020-09-30 ENCOUNTER — Other Ambulatory Visit (HOSPITAL_COMMUNITY)
Admission: RE | Admit: 2020-09-30 | Discharge: 2020-09-30 | Disposition: A | Discharge status: Home / Self Care | Payer: 59 | Source: Ambulatory Visit | Attending: Physician Assistant | Admitting: Physician Assistant

## 2020-09-30 ENCOUNTER — Other Ambulatory Visit: Payer: Self-pay

## 2020-09-30 ENCOUNTER — Encounter: Payer: Self-pay | Admitting: Physician Assistant

## 2020-09-30 ENCOUNTER — Ambulatory Visit (INDEPENDENT_AMBULATORY_CARE_PROVIDER_SITE_OTHER): Payer: 59 | Admitting: Physician Assistant

## 2020-09-30 VITALS — BP 110/70 | HR 87 | Temp 98.7°F | Ht 64.0 in | Wt 112.4 lb

## 2020-09-30 DIAGNOSIS — Z124 Encounter for screening for malignant neoplasm of cervix: Secondary | ICD-10-CM | POA: Insufficient documentation

## 2020-09-30 NOTE — Progress Notes (Signed)
Misty Williams is a 25 y.o. female is here for pap smear.  I acted as a Neurosurgeon for [employer], PA-C Corky Mull, LPN   History of Present Illness:   Chief Complaint  Patient presents with   Needs pap smear     HPI  Pt is here for pap smear only today. We completed physical last week. She was on per period at the time and wanted to wait until this week to perform PAP. Denies any concerns.  There are no preventive care reminders to display for this patient.  History reviewed. No pertinent past medical history.   Social History   Tobacco Use   Smoking status: Never   Smokeless tobacco: Never  Substance Use Topics   Alcohol use: Yes    Alcohol/week: 7.0 - 14.0 standard drinks    Types: 7 - 14 Cans of beer per week   Drug use: No    History reviewed. No pertinent surgical history.  Family History  Problem Relation Age of Onset   Hyperlipidemia Mother    Hypertension Mother    Breast cancer Mother 20   Hypertension Father    Hyperlipidemia Father    Heart disease Maternal Grandmother    Heart disease Maternal Grandfather    Alzheimer's disease Paternal Grandmother    Crohn's disease Maternal Aunt    Melanoma Maternal Aunt    Early death Maternal Aunt 33   Colon cancer Neg Hx     PMHx, SurgHx, SocialHx, FamHx, Medications, and Allergies were reviewed in the Visit Navigator and updated as appropriate.   Patient Active Problem List   Diagnosis Date Noted   Migraine with aura 09/25/2020   Syncope 11/21/2018   Oral contraceptive pill surveillance 11/21/2018   Fatigue 11/21/2018   Irritable bowel syndrome with diarrhea 11/21/2018    Social History   Tobacco Use   Smoking status: Never   Smokeless tobacco: Never  Substance Use Topics   Alcohol use: Yes    Alcohol/week: 7.0 - 14.0 standard drinks    Types: 7 - 14 Cans of beer per week   Drug use: No    Current Medications and Allergies:    Current Outpatient Medications:    norethindrone  (ORTHO MICRONOR) 0.35 MG tablet, Take 1 tablet (0.35 mg total) by mouth daily., Disp: 84 tablet, Rfl: 3   Allergies  Allergen Reactions   Penicillins Rash    Review of Systems   ROS Negative unless otherwise specified per HPI. Vitals:   Vitals:   09/30/20 1334  BP: 110/70  Pulse: 87  Temp: 98.7 F (37.1 C)  TempSrc: Temporal  SpO2: 98%  Weight: 112 lb 6.1 oz (51 kg)  Height: 5\' 4"  (1.626 m)     Body mass index is 19.29 kg/m.   Physical Exam:    Physical Exam Exam conducted with a chaperone present.  Constitutional:      Appearance: Normal appearance. She is well-developed.  HENT:     Head: Normocephalic and atraumatic.  Eyes:     General: Lids are normal.     Extraocular Movements: Extraocular movements intact.     Conjunctiva/sclera: Conjunctivae normal.  Pulmonary:     Effort: Pulmonary effort is normal.  Genitourinary:    Vagina: Normal.     Cervix: Normal.     Uterus: Normal.      Adnexa: Right adnexa normal and left adnexa normal.  Musculoskeletal:        General: Normal range of motion.  Cervical back: Normal range of motion and neck supple.  Skin:    General: Skin is warm and dry.  Neurological:     Mental Status: She is alert and oriented to person, place, and time.  Psychiatric:        Attention and Perception: Attention and perception normal.        Mood and Affect: Mood normal.        Behavior: Behavior normal.        Thought Content: Thought content normal.        Judgment: Judgment normal.     Assessment and Plan:    [name] was seen today for needs pap smear.  Diagnoses and all orders for this visit:  Pap smear for cervical cancer screening -     Cytology - PAP  PAP performed.  CMA or LPN served as scribe during this visit. History, Physical, and Plan performed by medical provider. The above documentation has been reviewed and is accurate and complete.  Jarold Motto, PA-C [place] [place] 09/30/2020  Follow-up:  No follow-ups on file.

## 2020-10-01 ENCOUNTER — Encounter: Payer: Self-pay | Admitting: Physician Assistant

## 2020-10-01 LAB — CYTOLOGY - PAP: Diagnosis: NEGATIVE

## 2020-10-02 ENCOUNTER — Other Ambulatory Visit: Payer: Self-pay | Admitting: Physician Assistant

## 2020-10-02 MED ORDER — FLUCONAZOLE 150 MG PO TABS: 150.0000 mg | ORAL_TABLET | Freq: Once | ORAL | 0 refills | Status: AC

## 2020-10-02 NOTE — Telephone Encounter (Signed)
See note

## 2020-12-04 ENCOUNTER — Encounter: Payer: Self-pay | Admitting: Physician Assistant

## 2021-11-02 ENCOUNTER — Encounter: Payer: Self-pay | Admitting: *Deleted

## 2022-01-21 ENCOUNTER — Encounter: Payer: Self-pay | Admitting: *Deleted
